# Patient Record
Sex: Male | Born: 1965 | Race: White | Hispanic: No | Marital: Married | Smoking: Never smoker
Health system: Southern US, Community
[De-identification: ages and names within clinical notes are randomized; demographics above are authoritative.]

## PROBLEM LIST (undated history)

## (undated) DIAGNOSIS — C4491 Basal cell carcinoma of skin, unspecified: Secondary | ICD-10-CM

## (undated) DIAGNOSIS — D229 Melanocytic nevi, unspecified: Secondary | ICD-10-CM

## (undated) DIAGNOSIS — T7840XA Allergy, unspecified, initial encounter: Secondary | ICD-10-CM

## (undated) HISTORY — PX: TONSILLECTOMY: SUR1361

## (undated) HISTORY — PX: WRIST FRACTURE SURGERY: SHX121

## (undated) HISTORY — DX: Basal cell carcinoma of skin, unspecified: C44.91

## (undated) HISTORY — DX: Allergy, unspecified, initial encounter: T78.40XA

## (undated) HISTORY — DX: Melanocytic nevi, unspecified: D22.9

---

## 2006-11-11 DIAGNOSIS — C4491 Basal cell carcinoma of skin, unspecified: Secondary | ICD-10-CM

## 2006-11-11 HISTORY — DX: Basal cell carcinoma of skin, unspecified: C44.91

## 2009-09-12 DIAGNOSIS — D229 Melanocytic nevi, unspecified: Secondary | ICD-10-CM

## 2009-09-12 HISTORY — DX: Melanocytic nevi, unspecified: D22.9

## 2017-06-02 DIAGNOSIS — C4491 Basal cell carcinoma of skin, unspecified: Secondary | ICD-10-CM

## 2017-06-02 HISTORY — DX: Basal cell carcinoma of skin, unspecified: C44.91

## 2019-04-25 ENCOUNTER — Encounter: Payer: Self-pay | Admitting: Internal Medicine

## 2019-05-26 ENCOUNTER — Ambulatory Visit (AMBULATORY_SURGERY_CENTER): Payer: Self-pay | Admitting: *Deleted

## 2019-05-26 ENCOUNTER — Other Ambulatory Visit: Payer: Self-pay

## 2019-05-26 VITALS — Temp 97.4°F | Ht 74.0 in | Wt 226.0 lb

## 2019-05-26 DIAGNOSIS — Z01818 Encounter for other preprocedural examination: Secondary | ICD-10-CM

## 2019-05-26 DIAGNOSIS — Z1211 Encounter for screening for malignant neoplasm of colon: Secondary | ICD-10-CM

## 2019-05-26 MED ORDER — NA SULFATE-K SULFATE-MG SULF 17.5-3.13-1.6 GM/177ML PO SOLN: ORAL | 0 refills | Status: DC

## 2019-05-26 NOTE — Progress Notes (Signed)

## 2019-06-06 ENCOUNTER — Ambulatory Visit (INDEPENDENT_AMBULATORY_CARE_PROVIDER_SITE_OTHER): Payer: Federal, State, Local not specified - PPO

## 2019-06-06 ENCOUNTER — Other Ambulatory Visit: Payer: Self-pay

## 2019-06-06 ENCOUNTER — Other Ambulatory Visit: Payer: Self-pay | Admitting: Internal Medicine

## 2019-06-06 DIAGNOSIS — Z1159 Encounter for screening for other viral diseases: Secondary | ICD-10-CM

## 2019-06-06 LAB — SARS CORONAVIRUS 2 (TAT 6-24 HRS): SARS Coronavirus 2: NEGATIVE

## 2019-06-09 ENCOUNTER — Encounter: Payer: Self-pay | Admitting: Internal Medicine

## 2019-06-09 ENCOUNTER — Ambulatory Visit (AMBULATORY_SURGERY_CENTER): Payer: Federal, State, Local not specified - PPO | Admitting: Internal Medicine

## 2019-06-09 ENCOUNTER — Other Ambulatory Visit: Payer: Self-pay

## 2019-06-09 VITALS — BP 158/91 | HR 67 | Temp 97.1°F | Resp 11 | Ht 74.0 in | Wt 226.0 lb

## 2019-06-09 DIAGNOSIS — D123 Benign neoplasm of transverse colon: Secondary | ICD-10-CM | POA: Diagnosis not present

## 2019-06-09 DIAGNOSIS — Z1211 Encounter for screening for malignant neoplasm of colon: Secondary | ICD-10-CM | POA: Diagnosis not present

## 2019-06-09 MED ORDER — SODIUM CHLORIDE 0.9 % IV SOLN: 500.0000 mL | Freq: Once | INTRAVENOUS | Status: DC

## 2019-06-09 NOTE — Progress Notes (Signed)
No problems noted in the recovery room. maw 

## 2019-06-09 NOTE — Progress Notes (Signed)
Temp check by:JB Vital check by:CW  The patient states no changes in medical or surgical history since pre-visit screening on 05/26/19.

## 2019-06-09 NOTE — Progress Notes (Signed)
Called to room to assist during endoscopic procedure.  Patient ID and intended procedure confirmed with present staff. Received instructions for my participation in the procedure from the performing physician.  

## 2019-06-09 NOTE — Patient Instructions (Signed)
YOU HAD AN ENDOSCOPIC PROCEDURE TODAY AT [facility]:   Refer to the procedure report that was given to you for any specific questions about what was found during the examination.  If the procedure report does not answer your questions, please call your gastroenterologist to clarify.  If you requested that your care partner not be given the details of your procedure findings, then the procedure report has been included in a sealed envelope for you to review at your convenience later.  YOU SHOULD EXPECT: Some feelings of bloating in the abdomen. Passage of more gas than usual.  Walking can help get rid of the air that was put into your GI tract during the procedure and reduce the bloating. If you had a lower endoscopy (such as a colonoscopy or flexible sigmoidoscopy) you may notice spotting of blood in your stool or on the toilet paper. If you underwent a bowel prep for your procedure, you may not have a normal bowel movement for a few days.  Please Note:  You might notice some irritation and congestion in your nose or some drainage.  This is from the oxygen used during your procedure.  There is no need for concern and it should clear up in a day or so.  SYMPTOMS TO REPORT IMMEDIATELY:   Following lower endoscopy (colonoscopy or flexible sigmoidoscopy):  Excessive amounts of blood in the stool  Significant tenderness or worsening of abdominal pains  Swelling of the abdomen that is new, acute  Fever of 100F or higher    For urgent or emergent issues, a gastroenterologist can be reached at any hour by calling [phone]. Do not use MyChart messaging for urgent concerns.    DIET:  We do recommend a small meal at first, but then you may proceed to your regular diet.  Drink plenty of fluids but you should avoid alcoholic beverages for 24 hours.  ACTIVITY:  You should plan to take it easy for the rest of today and you should NOT DRIVE or use heavy machinery until tomorrow  (because of the sedation medicines used during the test).    FOLLOW UP: Our staff will call the number listed on your records 48-72 hours following your procedure to check on you and address any questions or concerns that you may have regarding the information given to you following your procedure. If we do not reach you, we will leave a message.  We will attempt to reach you two times.  During this call, we will ask if you have developed any symptoms of COVID 19. If you develop any symptoms (ie: fever, flu-like symptoms, shortness of breath, cough etc.) before then, please call [phone].  If you test positive for Covid 19 in the 2 weeks post procedure, please call and report this information to [place].    If any biopsies were taken you will be contacted by phone or by letter within the next 1-3 weeks.  Please call us at [phone] if you have not heard about the biopsies in 3 weeks.    SIGNATURES/CONFIDENTIALITY: You and/or your care partner have signed paperwork which will be entered into your electronic medical record.  These signatures attest to the fact that that the information above on your After Visit Summary has been reviewed and is understood.  Full responsibility of the confidentiality of this discharge information lies with you and/or your care-partner.     Handouts were given to you on polyps, diverticulosis, and  hemorrhoids. You may  resume your current medications today. Await biopsy results. Please call if any questions or concerns.

## 2019-06-09 NOTE — Op Note (Signed)
[facility] Patient Name: Jared Burton Procedure Date: 06/09/2019 10:27 AM MRN: [id number] Endoscopist: Docia Chuck. Henrene Pastor , MD Age: 54 Referring MD:  Date of Birth: 1965/03/22 Gender: Male Account #: [id number] Procedure:                Colonoscopy with cold snare polypectomy x 1 Indications:              Screening for colorectal malignant neoplasm Medicines:                Monitored Anesthesia Care Procedure:                Pre-Anesthesia Assessment:                           - Prior to the procedure, a History and Physical                            was performed, and patient medications and                            allergies were reviewed. The patient's tolerance of                            previous anesthesia was also reviewed. The risks                            and benefits of the procedure and the sedation                            options and risks were discussed with the patient.                            All questions were answered, and informed consent                            was obtained. Prior Anticoagulants: The patient has                            taken no previous anticoagulant or antiplatelet                            agents. ASA Grade Assessment: II - A patient with                            mild systemic disease. After reviewing the risks                            and benefits, the patient was deemed in                            satisfactory condition to undergo the procedure.                           After obtaining informed consent, the colonoscope  was passed under direct vision. Throughout the                            procedure, the patient's blood pressure, pulse, and                            oxygen saturations were monitored continuously. The                            Colonoscope was introduced through the anus and                            advanced to the the cecum, identified by      appendiceal orifice and ileocecal valve. The                            ileocecal valve, appendiceal orifice, and rectum                            were photographed. The quality of the bowel                            preparation was excellent. The colonoscopy was                            performed without difficulty. The patient tolerated                            the procedure well. The bowel preparation used was                            SUPREP via split dose instruction. Scope In: 10:31:43 AM Scope Out: 10:44:44 AM Scope Withdrawal Time: 0 hours 10 minutes 4 seconds  Total Procedure Duration: 0 hours 13 minutes 1 second  Findings:                 A 2 mm polyp was found in the transverse colon. The                            polyp was removed with a cold snare. Resection and                            retrieval were complete.                           Scattered medium and small-mouthed diverticula were                            found in the entire colon.                           Internal hemorrhoids were found during                            retroflexion. The hemorrhoids  were small.                           The exam was otherwise without abnormality on                            direct and retroflexion views. Complications:            No immediate complications. Estimated blood loss:                            None. Estimated Blood Loss:     Estimated blood loss: none. Impression:               - One 2 mm polyp in the transverse colon, removed                            with a cold snare. Resected and retrieved.                           - Diverticulosis in the entire examined colon.                           - Internal hemorrhoids.                           - The examination was otherwise normal on direct                            and retroflexion views. Recommendation:           - Repeat colonoscopy in 7-10 years for surveillance.                           - Patient  has a contact number available for                            emergencies. The signs and symptoms of potential                            delayed complications were discussed with the                            patient. Return to normal activities tomorrow.                            Written discharge instructions were provided to the                            patient.                           - Resume previous diet.                           - Continue present medications.                           -  Await pathology results. Docia Chuck. Henrene Pastor, MD 06/09/2019 10:52:55 AM This report has been signed electronically.

## 2019-06-09 NOTE — Progress Notes (Signed)
pt tolerated well. VSS. awake and to recovery. Report given to RN.  

## 2019-06-13 ENCOUNTER — Encounter: Payer: Self-pay | Admitting: Internal Medicine

## 2019-06-13 ENCOUNTER — Telehealth: Payer: Self-pay | Admitting: *Deleted

## 2019-06-13 ENCOUNTER — Telehealth: Payer: Self-pay

## 2019-06-13 NOTE — Telephone Encounter (Signed)
First attempt, left VM.  

## 2019-06-13 NOTE — Telephone Encounter (Signed)
Left message on follow up call. 

## 2019-08-01 ENCOUNTER — Ambulatory Visit: Payer: Federal, State, Local not specified - PPO | Attending: Internal Medicine

## 2019-08-01 DIAGNOSIS — Z20822 Contact with and (suspected) exposure to covid-19: Secondary | ICD-10-CM

## 2019-08-02 LAB — SARS-COV-2, NAA 2 DAY TAT

## 2019-08-02 LAB — NOVEL CORONAVIRUS, NAA: SARS-CoV-2, NAA: NOT DETECTED

## 2020-06-27 ENCOUNTER — Ambulatory Visit: Payer: Federal, State, Local not specified - PPO | Admitting: Dermatology

## 2020-06-27 ENCOUNTER — Other Ambulatory Visit: Payer: Self-pay

## 2020-06-27 ENCOUNTER — Encounter: Payer: Self-pay | Admitting: Dermatology

## 2020-06-27 DIAGNOSIS — C44712 Basal cell carcinoma of skin of right lower limb, including hip: Secondary | ICD-10-CM

## 2020-06-27 DIAGNOSIS — C4372 Malignant melanoma of left lower limb, including hip: Secondary | ICD-10-CM | POA: Diagnosis not present

## 2020-06-27 DIAGNOSIS — Z1283 Encounter for screening for malignant neoplasm of skin: Secondary | ICD-10-CM | POA: Diagnosis not present

## 2020-06-27 DIAGNOSIS — C44719 Basal cell carcinoma of skin of left lower limb, including hip: Secondary | ICD-10-CM

## 2020-06-27 DIAGNOSIS — L821 Other seborrheic keratosis: Secondary | ICD-10-CM

## 2020-06-27 DIAGNOSIS — D485 Neoplasm of uncertain behavior of skin: Secondary | ICD-10-CM

## 2020-06-27 NOTE — Patient Instructions (Signed)

## 2020-07-09 ENCOUNTER — Telehealth (INDEPENDENT_AMBULATORY_CARE_PROVIDER_SITE_OTHER): Payer: Federal, State, Local not specified - PPO | Admitting: Dermatology

## 2020-07-09 ENCOUNTER — Encounter: Payer: Self-pay | Admitting: Dermatology

## 2020-07-09 NOTE — Telephone Encounter (Signed)
I called Jared Burton and reached him on his listed cell number.  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  He is to be scheduled for repeat needle biopsy under ultrasound guidance, but I emphasized the need to make sure the surgeons know of the melanoma on the same side.  If he has not heard from them or reach them in the next 2 days, he will call me back and we will contact that office for him.  All his questions were answered.  He has my cell phone.

## 2020-07-12 ENCOUNTER — Telehealth: Payer: Self-pay | Admitting: *Deleted

## 2020-07-12 NOTE — Telephone Encounter (Signed)
Phone call from [facility] needing patients recent pathology report. I faxed over the report to them.

## 2020-07-28 NOTE — Progress Notes (Signed)
   Follow-Up Visit   Subjective  Jared Burton is a 55 y.o. male who presents for the following: Annual Exam (New lesion on left inner thigh & Left scalp- "dark").  Several changing spots on legs plus dark spot on left scalp; general skin exam Location:  Duration:  Quality:  Associated Signs/Symptoms: Modifying Factors:  Severity:  Timing: Context:   Objective  Well appearing patient in no apparent distress; mood and affect are within normal limits. Objective  Left Knee - Anterior: Pink-gray     Objective  Right inner calf: Follow-up for a     Objective  Left Lower Leg - Anterior: Centrally ulcerated 6 mm pink papule, SCCA     Objective  Mid Back: Annual skin examination, 3 atypical lesions on legs will be biopsied.  Objective  Left Parietal Scalp: Brown textured 5 mm papule    A full examination was performed including scalp, head, eyes, ears, nose, lips, neck, chest, axillae, abdomen, back, buttocks, bilateral upper extremities, bilateral lower extremities, hands, feet, fingers, toes, fingernails, and toenails. All findings within normal limits unless otherwise noted below.   Assessment & Plan    Neoplasm of uncertain behavior of skin (3) Left Knee - Anterior  Skin / nail biopsy Type of biopsy: tangential   Informed consent: discussed and consent obtained   Timeout: patient name, date of birth, surgical site, and procedure verified   Anesthesia: the lesion was anesthetized in a standard fashion   Anesthetic:  1% lidocaine w/ epinephrine 1-100,000 local infiltration Instrument used: flexible razor blade   Hemostasis achieved with: ferric subsulfate   Outcome: patient tolerated procedure well   Post-procedure details: wound care instructions given    Specimen 1 - Surgical pathology Differential Diagnosis: scc vs bcc  Check Margins: No  Right inner calf  Skin / nail biopsy Type of biopsy: tangential   Informed consent: discussed and consent  obtained   Timeout: patient name, date of birth, surgical site, and procedure verified   Anesthesia: the lesion was anesthetized in a standard fashion   Anesthetic:  1% lidocaine w/ epinephrine 1-100,000 local infiltration Instrument used: flexible razor blade   Hemostasis achieved with: ferric subsulfate   Outcome: patient tolerated procedure well   Post-procedure details: wound care instructions given    Specimen 2 - Surgical pathology Differential Diagnosis: scc vs bcc  Check Margins: No  Left Lower Leg - Anterior  Skin / nail biopsy Type of biopsy: tangential   Informed consent: discussed and consent obtained   Timeout: patient name, date of birth, surgical site, and procedure verified   Anesthesia: the lesion was anesthetized in a standard fashion   Anesthetic:  1% lidocaine w/ epinephrine 1-100,000 local infiltration Instrument used: flexible razor blade   Hemostasis achieved with: ferric subsulfate   Outcome: patient tolerated procedure well   Post-procedure details: wound care instructions given    Specimen 3 - Surgical pathology Differential Diagnosis: scc vs bcc  Check Margins: No  Encounter for screening for malignant neoplasm of skin Mid Back  Annual skin examination, encouraged to self examine twice annually.  Seborrheic keratosis Left Parietal Scalp  May leave if stable      I, Lavonna Monarch, MD, have reviewed all documentation for this visit.  The documentation on 07/28/20 for the exam, diagnosis, procedures, and orders are all accurate and complete.

## 2020-08-06 ENCOUNTER — Telehealth: Payer: Self-pay | Admitting: Oncology

## 2020-08-06 NOTE — Telephone Encounter (Signed)
Scheduled appt per 6/13 referral. Called pt, no answer. Left msg with appt date and time.

## 2020-08-15 ENCOUNTER — Other Ambulatory Visit: Payer: Self-pay

## 2020-08-15 ENCOUNTER — Inpatient Hospital Stay: Payer: Federal, State, Local not specified - PPO | Attending: Oncology | Admitting: Oncology

## 2020-08-15 ENCOUNTER — Ambulatory Visit: Payer: Federal, State, Local not specified - PPO | Admitting: Oncology

## 2020-08-15 DIAGNOSIS — C4371 Malignant melanoma of right lower limb, including hip: Secondary | ICD-10-CM | POA: Diagnosis not present

## 2020-08-15 DIAGNOSIS — C774 Secondary and unspecified malignant neoplasm of inguinal and lower limb lymph nodes: Secondary | ICD-10-CM | POA: Diagnosis not present

## 2020-08-15 DIAGNOSIS — Z85820 Personal history of malignant melanoma of skin: Secondary | ICD-10-CM

## 2020-08-15 DIAGNOSIS — C7951 Secondary malignant neoplasm of bone: Secondary | ICD-10-CM | POA: Insufficient documentation

## 2020-08-15 DIAGNOSIS — C430 Malignant melanoma of lip: Secondary | ICD-10-CM

## 2020-08-15 DIAGNOSIS — C4372 Malignant melanoma of left lower limb, including hip: Secondary | ICD-10-CM | POA: Diagnosis not present

## 2020-08-15 DIAGNOSIS — C439 Malignant melanoma of skin, unspecified: Secondary | ICD-10-CM

## 2020-08-15 MED ORDER — PROCHLORPERAZINE MALEATE 10 MG PO TABS: 10.0000 mg | ORAL_TABLET | Freq: Four times a day (QID) | ORAL | 0 refills | Status: DC | PRN

## 2020-08-15 NOTE — Progress Notes (Signed)
Reason for the request:    Melanoma  HPI: I was asked by Dr. Nash Mantis to evaluate Jared Burton for the evaluation of melanoma.  He is a 55 year old man with with allergy as well as basal cell carcinoma of the skin diagnosed on multiple occasions.  He was found to have a mass on the left groin growing recently.  He was evaluated by Dr. Nash Mantis of general surgery at [facility] and underwent a core biopsy in May 2022.  The biopsy of his left inguinal area showed melanoma.  He subsequently underwent a wide excision of the left thigh melanoma as well as a left inguinal lymph node dissection on July 26, 2020.  The final pathology showed no residual melanoma with 1 out of 10 lymph nodes showed metastatic disease.  PET scan obtained on Jul 20, 2020 showed hypermetabolic left inguinal mass and small hypermetabolic distal left external iliac lymph node.   He has also had a left knee/thigh skin lesion which showed malignant melanoma measuring 1.25 mm with the pathological staging T2A.  This likely represents the primary tumor.  Since his surgery, he reports feeling well and has tolerated the procedure without any major complaints.  He does report lower extremity edema but overall fully healed.  He is able to ambulate without any major difficulties.  He does not report any headaches, blurry vision, syncope or seizures. Does not report any fevers, chills or sweats.  Does not report any cough, wheezing or hemoptysis.  Does not report any chest pain, palpitation, orthopnea or leg edema.  Does not report any nausea, vomiting or abdominal pain.  Does not report any constipation or diarrhea.  Does not report any skeletal complaints.    Does not report frequency, urgency or hematuria.  Does not report any skin rashes or lesions. Does not report any heat or cold intolerance.  Does not report any lymphadenopathy or petechiae.  Does not report any anxiety or depression.  Remaining review of systems is negative.     Past  Medical History:  Diagnosis Date   Allergy    Atypical nevus 09/12/2009   Mid Lower - Moderate to Severe   Atypical nevus 09/12/2009   Left Medial Leg - Severe/Evolution of Melanoma in Situ   BCC (basal cell carcinoma of skin) 11/11/2006   Left Upper Arm   BCC (basal cell carcinoma of skin) 11/11/2006   Left Temple   BCC (basal cell carcinoma of skin) 11/11/2006   Right Front Shoulder   BCC (basal cell carcinoma of skin) 11/11/2006   Right Arm Crease/Elbow   BCC (basal cell carcinoma of skin) 09/12/2009   Left Shoulder   BCC (basal cell carcinoma of skin) 09/12/2009   Left Lower Back   BCC (basal cell carcinoma of skin) 12/12/2009   Left Top Shoulder   BCC (basal cell carcinoma of skin) 05/23/2013   Left Posterior Auricular, Right Chest, Right Upper Back, Right Mid Back Lateral, Right Mid Back Medial, Left Upper Arm, Left Calf   BCC (basal cell carcinoma of skin) Ulcerated 09/12/2009   Right Lateral Leg   BCC (basal cell carcinoma of skin) Ulcerated and Infiltrative 09/12/2009   Right Hand   Infiltrative basal cell carcinoma (BCC) 06/02/2017   Left Tip of Nose   Nodular basal cell carcinoma (BCC) 06/02/2017   Mid Upper Back, Left Nare, Right Upper Arm Inferior   Superficial basal cell carcinoma (BCC) 11/11/2006   Left Upper Back   Superficial basal cell carcinoma (BCC) 11/11/2006  Mid Upper Chest   Superficial basal cell carcinoma (BCC) 06/02/2017    Right Upper Arm Superior, Back Left Lower Thigh, Back Right Knee Crease   Superficial basal cell carcinoma (BCC) 06/02/2017   Left Ear Rim   Superficial nodular basal cell carcinoma (BCC) 06/02/2017   Right Inner Deltoid  :   Past Surgical History:  Procedure Laterality Date   TONSILLECTOMY     as a child   WRIST FRACTURE SURGERY     as a child  :   Current Outpatient Medications:    IBUPROFEN PO, Take by mouth. As needed, Disp: , Rfl:    levocetirizine (XYZAL) 5 MG tablet, Take 5 mg by mouth every evening., Disp:  , Rfl: :  No Known Allergies:   Family History  Problem Relation Age of Onset   Colon cancer Neg Hx    Colon polyps Neg Hx    Esophageal cancer Neg Hx    Stomach cancer Neg Hx    Rectal cancer Neg Hx   :   Social History   Socioeconomic History   Marital status: Married    Spouse name: Not on file   Number of children: Not on file   Years of education: Not on file   Highest education level: Not on file  Occupational History   Not on file  Tobacco Use   Smoking status: Never   Smokeless tobacco: Never  Vaping Use   Vaping Use: Never used  Substance and Sexual Activity   Alcohol use: Yes    Comment: a few drinks / week (beer and wine)   Drug use: Never   Sexual activity: Not on file  Other Topics Concern   Not on file  Social History Narrative   Not on file   Social Determinants of Health   Financial Resource Strain: Not on file  Food Insecurity: Not on file  Transportation Needs: Not on file  Physical Activity: Not on file  Stress: Not on file  Social Connections: Not on file  Intimate Partner Violence: Not on file  :  Pertinent items are noted in HPI.  Exam: Blood pressure (!) 149/87, pulse 69, temperature (!) 97 F (36.1 C), temperature source Tympanic, resp. rate 20, weight 221 lb 4.8 oz (100.4 kg), SpO2 100 %. ECOG 1 General appearance: alert and cooperative appeared without distress. Head: atraumatic without any abnormalities. Eyes: conjunctivae/corneas clear. PERRL.  Sclera anicteric. Throat: lips, mucosa, and tongue normal; without oral thrush or ulcers. Resp: clear to auscultation bilaterally without rhonchi, wheezes or dullness to percussion. Cardio: regular rate and rhythm, S1, S2 normal, no murmur.  Left leg edema noted. GI: soft, non-tender; bowel sounds normal; no masses,  no organomegaly Skin: Well-healed scar noted on his left knee and inguinal area. Lymph nodes: Cervical, supraclavicular, and axillary nodes normal. Neurologic: Grossly  normal without any motor, sensory or deep tendon reflexes. Musculoskeletal: No joint deformity or effusion.   Assessment and Plan:   55 year old with:  1.  Melanoma of the left lower extremity presented with a knee lesion as well as inguinal lymph node involvement.  He is status post wide excision and lymph node dissection and found to have T2aN1 melanoma with 1 out of 10 lymph nodes involved.  The original tumor appears to be superficial spreading without ulceration and risk tumor infiltrating lymphocytes.   The natural course of this disease was reviewed at this time and treatment options were discussed.  Given his high risk disease he would benefit from  additional systemic therapy.  The role of adjuvant immunotherapy in this particular setting was discussed.  The risk of progression free survival that has been documented.  His BRAF status is unknown and will obtain his tumor for evaluation next at tumor board and obtain BRAF analysis.  Complication associated with immunotherapy were discussed.  These include nausea, fatigue, immune mediated complications among others.  Schedule ranged from every 3 to 6 weeks to complete a year.  In the meantime strict surveillance will be required with repeat imaging studies every 6 months.  He is agreeable to proceed at this time.   2.  IV access: Peripheral veins will be in use at this time.   3.  Immune mediated complications: These include pneumonitis, colitis and thyroid disease.  He will continue to have thyroid function monitoring.   4.  Antiemetics: Prescription for Compazine will be available to him.  5.  Dermatology surveillance: He will continue to follow with dermatology as he has been doing.   6.  Follow-up: In the near future to start treatment.  60  minutes were dedicated to this visit. The time was spent on reviewing laboratory data, imaging studies, discussing treatment options, reviewing pathology results and answering questions  regarding future plan.    A copy of this consult has been forwarded to the requesting physician.

## 2020-08-15 NOTE — Progress Notes (Signed)
START ON PATHWAY REGIMEN - Melanoma and Other Skin Cancers     A cycle is every 42 days:     Pembrolizumab   **Always confirm dose/schedule in your pharmacy ordering system**  Patient Characteristics: Melanoma, Cutaneous/Unknown Primary, Postoperative without Neoadjuvant Therapy (Pathologic Staging), Any pT, pN+, BRAF V600 Wild Type / BRAF V600 Results Pending or Unknown Disease Classification: Melanoma Disease Subtype: Cutaneous BRAF V600 Mutation Status: Awaiting BRAF V600 Results Therapeutic Status: Postoperative without Neoadjuvant Therapy (Pathologic Staging) AJCC T Category: pT2a AJCC N Category: pN1c AJCC M Category: cM0 AJCC 8 Stage Grouping: IIIB Intent of Therapy: Curative Intent, Discussed with Patient

## 2020-08-22 ENCOUNTER — Telehealth: Payer: Self-pay | Admitting: Oncology

## 2020-08-22 NOTE — Telephone Encounter (Signed)
R/s appt per 6/29 sch msg. Pt aware.

## 2020-08-23 ENCOUNTER — Other Ambulatory Visit: Payer: Federal, State, Local not specified - PPO

## 2020-08-24 ENCOUNTER — Other Ambulatory Visit: Payer: Self-pay

## 2020-08-24 ENCOUNTER — Inpatient Hospital Stay: Payer: Federal, State, Local not specified - PPO | Attending: Oncology

## 2020-08-24 ENCOUNTER — Encounter: Payer: Self-pay | Admitting: Oncology

## 2020-08-24 DIAGNOSIS — Z79899 Other long term (current) drug therapy: Secondary | ICD-10-CM | POA: Insufficient documentation

## 2020-08-24 DIAGNOSIS — Z5112 Encounter for antineoplastic immunotherapy: Secondary | ICD-10-CM | POA: Insufficient documentation

## 2020-08-24 DIAGNOSIS — C4372 Malignant melanoma of left lower limb, including hip: Secondary | ICD-10-CM | POA: Insufficient documentation

## 2020-08-24 DIAGNOSIS — C774 Secondary and unspecified malignant neoplasm of inguinal and lower limb lymph nodes: Secondary | ICD-10-CM | POA: Insufficient documentation

## 2020-08-24 NOTE — Progress Notes (Signed)
Met with patient at registration to introduce myself as Arboriculturist and to offer available resources.  Discussed one-time $1000 Radio broadcast assistant to assist with personal expenses while going through treatment. Also, discussed available copay assistance([employer] for [facility]) for specific treatment drug if he has not met his ded/OOP and insurance leaves him with a balance.  Gave him my card if interested in applying and for any additional financial questions or concerns.

## 2020-08-31 ENCOUNTER — Telehealth: Payer: Self-pay | Admitting: Oncology

## 2020-08-31 NOTE — Telephone Encounter (Signed)
Pt called in requesting to move his appts back by a few weeks. I moved his appts per requested and sent msg to MD informing him of the change. Per MD okay.

## 2020-09-04 ENCOUNTER — Other Ambulatory Visit: Payer: Federal, State, Local not specified - PPO

## 2020-09-04 ENCOUNTER — Ambulatory Visit: Payer: Federal, State, Local not specified - PPO

## 2020-09-11 NOTE — Progress Notes (Signed)
Pharmacist Chemotherapy Monitoring - Initial Assessment    Anticipated start date: 09/18/20   The following has been reviewed per standard work regarding the patient's treatment regimen: The patient's diagnosis, treatment plan and drug doses, and organ/hematologic function Lab orders and baseline tests specific to treatment regimen  The treatment plan start date, drug sequencing, and pre-medications Prior authorization status  Patient's documented medication list, including drug-drug interaction screen and prescriptions for anti-emetics and supportive care specific to the treatment regimen The drug concentrations, fluid compatibility, administration routes, and timing of the medications to be used The patient's access for treatment and lifetime cumulative dose history, if applicable  The patient's medication allergies and previous infusion related reactions, if applicable   Changes made to treatment plan:  N/A  Follow up needed:  N/A   Larene Beach, RPH, 09/11/2020  4:17 PM

## 2020-09-13 ENCOUNTER — Encounter: Payer: Self-pay | Admitting: Dermatology

## 2020-09-13 ENCOUNTER — Ambulatory Visit (INDEPENDENT_AMBULATORY_CARE_PROVIDER_SITE_OTHER): Payer: Federal, State, Local not specified - PPO | Admitting: Dermatology

## 2020-09-13 ENCOUNTER — Other Ambulatory Visit: Payer: Self-pay

## 2020-09-13 DIAGNOSIS — Z85820 Personal history of malignant melanoma of skin: Secondary | ICD-10-CM | POA: Diagnosis not present

## 2020-09-13 DIAGNOSIS — S30860A Insect bite (nonvenomous) of lower back and pelvis, initial encounter: Secondary | ICD-10-CM | POA: Diagnosis not present

## 2020-09-13 DIAGNOSIS — W57XXXA Bitten or stung by nonvenomous insect and other nonvenomous arthropods, initial encounter: Secondary | ICD-10-CM

## 2020-09-13 NOTE — Patient Instructions (Signed)

## 2020-09-18 ENCOUNTER — Inpatient Hospital Stay: Payer: Federal, State, Local not specified - PPO

## 2020-09-18 ENCOUNTER — Other Ambulatory Visit: Payer: Self-pay

## 2020-09-18 VITALS — BP 169/95 | HR 63 | Temp 97.7°F | Resp 18 | Wt 225.8 lb

## 2020-09-18 DIAGNOSIS — C439 Malignant melanoma of skin, unspecified: Secondary | ICD-10-CM

## 2020-09-18 DIAGNOSIS — C4372 Malignant melanoma of left lower limb, including hip: Secondary | ICD-10-CM | POA: Diagnosis present

## 2020-09-18 DIAGNOSIS — Z5112 Encounter for antineoplastic immunotherapy: Secondary | ICD-10-CM | POA: Diagnosis present

## 2020-09-18 DIAGNOSIS — Z79899 Other long term (current) drug therapy: Secondary | ICD-10-CM | POA: Diagnosis not present

## 2020-09-18 DIAGNOSIS — C774 Secondary and unspecified malignant neoplasm of inguinal and lower limb lymph nodes: Secondary | ICD-10-CM | POA: Diagnosis not present

## 2020-09-18 LAB — CMP (CANCER CENTER ONLY)
ALT: 14 U/L (ref 0–44)
AST: 13 U/L — ABNORMAL LOW (ref 15–41)
Albumin: 3.5 g/dL (ref 3.5–5.0)
Alkaline Phosphatase: 98 U/L (ref 38–126)
Anion gap: 8 (ref 5–15)
BUN: 14 mg/dL (ref 6–20)
CO2: 26 mmol/L (ref 22–32)
Calcium: 9.2 mg/dL (ref 8.9–10.3)
Chloride: 106 mmol/L (ref 98–111)
Creatinine: 1.05 mg/dL (ref 0.61–1.24)
GFR, Estimated: >60 mL/min (ref >60)
Glucose, Bld: 113 mg/dL — ABNORMAL HIGH (ref 70–99)
Potassium: 3.8 mmol/L (ref 3.5–5.1)
Sodium: 140 mmol/L (ref 135–145)
Total Bilirubin: 0.8 mg/dL (ref 0.3–1.2)
Total Protein: 6.3 g/dL — ABNORMAL LOW (ref 6.5–8.1)

## 2020-09-18 LAB — CBC WITH DIFFERENTIAL (CANCER CENTER ONLY)
Abs Immature Granulocytes: 0.01 10*3/uL (ref 0.00–0.07)
Basophils Absolute: 0 10*3/uL (ref 0.0–0.1)
Basophils Relative: 0 %
Eosinophils Absolute: 0.1 10*3/uL (ref 0.0–0.5)
Eosinophils Relative: 1 %
HCT: 45.1 % (ref 39.0–52.0)
Hemoglobin: 15.4 g/dL (ref 13.0–17.0)
Immature Granulocytes: 0 %
Lymphocytes Relative: 23 %
Lymphs Abs: 1.6 10*3/uL (ref 0.7–4.0)
MCH: 28.8 pg (ref 26.0–34.0)
MCHC: 34.1 g/dL (ref 30.0–36.0)
MCV: 84.3 fL (ref 80.0–100.0)
Monocytes Absolute: 0.7 10*3/uL (ref 0.1–1.0)
Monocytes Relative: 10 %
Neutro Abs: 4.6 10*3/uL (ref 1.7–7.7)
Neutrophils Relative %: 66 %
Platelet Count: 215 10*3/uL (ref 150–400)
RBC: 5.35 MIL/uL (ref 4.22–5.81)
RDW: 13.2 % (ref 11.5–15.5)
WBC Count: 7.1 10*3/uL (ref 4.0–10.5)
nRBC: 0 % (ref 0.0–0.2)

## 2020-09-18 LAB — TSH: TSH: 1.399 u[IU]/mL (ref 0.320–4.118)

## 2020-09-18 MED ORDER — SODIUM CHLORIDE 0.9 % IV SOLN
400.0000 mg | Freq: Once | INTRAVENOUS | Status: AC
  Administered 2020-09-18: 400 mg via INTRAVENOUS
  Filled 2020-09-18: qty 16

## 2020-09-18 MED ORDER — SODIUM CHLORIDE 0.9 % IV SOLN
Freq: Once | INTRAVENOUS | Status: AC
Start: 2020-09-18 — End: 2020-09-18
  Filled 2020-09-18: qty 250

## 2020-09-18 NOTE — Patient Instructions (Signed)
[facility] ONCOLOGY  Discharge Instructions: Thank you for choosing [facility] to provide your oncology and hematology care.   If you have a lab appointment with the [facility], please go directly to the [facility] and check in at the registration area.   Wear comfortable clothing and clothing appropriate for easy access to any Portacath or PICC line.   We strive to give you quality time with your provider. You may need to reschedule your appointment if you arrive late (15 or more minutes).  Arriving late affects you and other patients whose appointments are after yours.  Also, if you miss three or more appointments without notifying the office, you may be dismissed from the clinic at the provider's discretion.      For prescription refill requests, have your pharmacy contact our office and allow 72 hours for refills to be completed.    Today you received the following chemotherapy and/or immunotherapy agents: Keytruda    To help prevent nausea and vomiting after your treatment, we encourage you to take your nausea medication as directed.  BELOW ARE SYMPTOMS THAT SHOULD BE REPORTED IMMEDIATELY: *FEVER GREATER THAN 100.4 F (38 C) OR HIGHER *CHILLS OR SWEATING *NAUSEA AND VOMITING THAT IS NOT CONTROLLED WITH YOUR NAUSEA MEDICATION *UNUSUAL SHORTNESS OF BREATH *UNUSUAL BRUISING OR BLEEDING *URINARY PROBLEMS (pain or burning when urinating, or frequent urination) *BOWEL PROBLEMS (unusual diarrhea, constipation, pain near the anus) TENDERNESS IN MOUTH AND THROAT WITH OR WITHOUT PRESENCE OF ULCERS (sore throat, sores in mouth, or a toothache) UNUSUAL RASH, SWELLING OR PAIN  UNUSUAL VAGINAL DISCHARGE OR ITCHING   Items with * indicate a potential emergency and should be followed up as soon as possible or go to the Emergency Department if any problems should occur.  Please show the CHEMOTHERAPY ALERT CARD or IMMUNOTHERAPY ALERT CARD at check-in to the  Emergency Department and triage nurse.  Should you have questions after your visit or need to cancel or reschedule your appointment, please contact [facility]  Dept: [phone]  and follow the prompts.  Office hours are 8:00 a.m. to 4:30 p.m. Monday - Friday. Please note that voicemails left after 4:00 p.m. may not be returned until the following business day.  We are closed weekends and major holidays. You have access to a nurse at all times for urgent questions. Please call the main number to the clinic Dept: [phone] and follow the prompts.   For any non-urgent questions, you may also contact your provider using MyChart. We now offer e-Visits for anyone 57 and older to request care online for non-urgent symptoms. For details visit [email or web address].   Also download the MyChart app! Go to the app store, search "MyChart", open the app, select [facility], and log in with your MyChart username and password.  Due to Covid, a mask is required upon entering the hospital/clinic. If you do not have a mask, one will be given to you upon arrival. For doctor visits, patients may have 1 support person aged 81 or older with them. For treatment visits, patients cannot have anyone with them due to current Covid guidelines and our immunocompromised population.   Pembrolizumab injection What is this medication? PEMBROLIZUMAB (pem broe liz ue mab) is a monoclonal antibody. It is used totreat certain types of cancer. This medicine may be used for other purposes; ask your health care provider orpharmacist if you have questions. COMMON BRAND NAME(S): Keytruda What should I tell my  care team before I take this medication? They need to know if you have any of these conditions: autoimmune diseases like Crohn's disease, ulcerative colitis, or lupus have had or planning to have an allogeneic stem cell transplant (uses someone else's stem cells) history of organ  transplant history of chest radiation nervous system problems like myasthenia gravis or Guillain-Barre syndrome an unusual or allergic reaction to pembrolizumab, other medicines, foods, dyes, or preservatives pregnant or trying to get pregnant breast-feeding How should I use this medication? This medicine is for infusion into a vein. It is given by a health careprofessional in a hospital or clinic setting. A special MedGuide will be given to you before each treatment. Be sure to readthis information carefully each time. Talk to your pediatrician regarding the use of this medicine in children. While this drug may be prescribed for children as young as 6 months for selectedconditions, precautions do apply. Overdosage: If you think you have taken too much of this medicine contact apoison control center or emergency room at once. NOTE: This medicine is only for you. Do not share this medicine with others. What if I miss a dose? It is important not to miss your dose. Call your doctor or health careprofessional if you are unable to keep an appointment. What may interact with this medication? Interactions have not been studied. This list may not describe all possible interactions. Give your health care provider a list of all the medicines, herbs, non-prescription drugs, or dietary supplements you use. Also tell them if you smoke, drink alcohol, or use illegaldrugs. Some items may interact with your medicine. What should I watch for while using this medication? Your condition will be monitored carefully while you are receiving thismedicine. You may need blood work done while you are taking this medicine. Do not become pregnant while taking this medicine or for 4 months after stopping it. Women should inform their doctor if they wish to become pregnant or think they might be pregnant. There is a potential for serious side effects to an unborn child. Talk to your health care professional or pharmacist for  more information. Do not breast-feed an infant while taking this medicine orfor 4 months after the last dose. What side effects may I notice from receiving this medication? Side effects that you should report to your doctor or health care professionalas soon as possible: allergic reactions like skin rash, itching or hives, swelling of the face, lips, or tongue bloody or black, tarry breathing problems changes in vision chest pain chills confusion constipation cough diarrhea dizziness or feeling faint or lightheaded fast or irregular heartbeat fever flushing joint pain low blood counts - this medicine may decrease the number of white blood cells, red blood cells and platelets. You may be at increased risk for infections and bleeding. muscle pain muscle weakness pain, tingling, numbness in the hands or feet persistent headache redness, blistering, peeling or loosening of the skin, including inside the mouth signs and symptoms of high blood sugar such as dizziness; dry mouth; dry skin; fruity breath; nausea; stomach pain; increased hunger or thirst; increased urination signs and symptoms of kidney injury like trouble passing urine or change in the amount of urine signs and symptoms of liver injury like dark urine, light-colored stools, loss of appetite, nausea, right upper belly pain, yellowing of the eyes or skin sweating swollen lymph nodes weight loss Side effects that usually do not require medical attention (report to yourdoctor or health care professional if they continue or are  bothersome): decreased appetite hair loss tiredness This list may not describe all possible side effects. Call your doctor for medical advice about side effects. You may report side effects to FDA at1-800-FDA-1088. Where should I keep my medication? This drug is given in a hospital or clinic and will not be stored at home. NOTE: This sheet is a summary. It may not cover all possible information. If you  have questions about this medicine, talk to your doctor, pharmacist, orhealth care provider.  2022 Elsevier/Gold Standard (2019-01-12 21:44:53)

## 2020-09-19 ENCOUNTER — Telehealth: Payer: Self-pay | Admitting: *Deleted

## 2020-09-22 ENCOUNTER — Encounter: Payer: Self-pay | Admitting: Dermatology

## 2020-09-22 NOTE — Progress Notes (Signed)
   Follow-Up Visit   Subjective  Jared Burton is a 55 y.o. male who presents for the following: Procedure (Bcc x 2 right inner calf, left lower leg ant. Patient on doxycycline due to tick bite ).  Melanoma with lymph node involvement, nonmelanoma skin cancer on each leg. Location:  Duration:  Quality:  Associated Signs/Symptoms: Modifying Factors:  Severity:  Timing: Context:   Objective  Well appearing patient in no apparent distress; mood and affect are within normal limits. Left Knee - Anterior History of melanoma plus lymph node surgery done and patient stated he will start keytruda every 6 weeks next week defer any treatment today        Left Hip       A focused examination was performed including left lower abdomen and groin, both legs.. Relevant physical exam findings are noted in the Assessment and Plan.   Assessment & Plan    Personal history of malignant melanoma of skin Left Knee - Anterior  I spent roughly 27minutes discussing in some detail the nature of melanoma with lymph node involvement and the treatment he will be starting.  He already has significant postop edema in the left upper leg with possible lymphedema, so treatment of the nonmelanoma skin cancer on lower left leg is best deferred.  Tick bite of lower back, initial encounter Left Hip      I, Lavonna Monarch, MD, have reviewed all documentation for this visit.  The documentation on 09/22/20 for the exam, diagnosis, procedures, and orders are all accurate and complete.

## 2020-10-16 ENCOUNTER — Other Ambulatory Visit: Payer: Federal, State, Local not specified - PPO

## 2020-10-16 ENCOUNTER — Ambulatory Visit: Payer: Federal, State, Local not specified - PPO

## 2020-10-16 ENCOUNTER — Ambulatory Visit: Payer: Federal, State, Local not specified - PPO | Admitting: Oncology

## 2020-10-30 ENCOUNTER — Other Ambulatory Visit: Payer: Self-pay

## 2020-10-30 ENCOUNTER — Inpatient Hospital Stay: Payer: Federal, State, Local not specified - PPO | Admitting: Oncology

## 2020-10-30 ENCOUNTER — Inpatient Hospital Stay: Payer: Federal, State, Local not specified - PPO

## 2020-10-30 ENCOUNTER — Inpatient Hospital Stay: Payer: Federal, State, Local not specified - PPO | Attending: Oncology

## 2020-10-30 VITALS — BP 151/92 | HR 75 | Temp 97.8°F | Resp 16 | Ht 74.0 in | Wt 222.1 lb

## 2020-10-30 DIAGNOSIS — C439 Malignant melanoma of skin, unspecified: Secondary | ICD-10-CM | POA: Diagnosis not present

## 2020-10-30 DIAGNOSIS — Z5112 Encounter for antineoplastic immunotherapy: Secondary | ICD-10-CM | POA: Insufficient documentation

## 2020-10-30 DIAGNOSIS — Z79899 Other long term (current) drug therapy: Secondary | ICD-10-CM | POA: Diagnosis not present

## 2020-10-30 DIAGNOSIS — C4372 Malignant melanoma of left lower limb, including hip: Secondary | ICD-10-CM | POA: Insufficient documentation

## 2020-10-30 LAB — CMP (CANCER CENTER ONLY)
ALT: 30 U/L (ref 0–44)
AST: 16 U/L (ref 15–41)
Albumin: 3.4 g/dL — ABNORMAL LOW (ref 3.5–5.0)
Alkaline Phosphatase: 115 U/L (ref 38–126)
Anion gap: 9 (ref 5–15)
BUN: 18 mg/dL (ref 6–20)
CO2: 23 mmol/L (ref 22–32)
Calcium: 9.2 mg/dL (ref 8.9–10.3)
Chloride: 109 mmol/L (ref 98–111)
Creatinine: 1.09 mg/dL (ref 0.61–1.24)
GFR, Estimated: >60 mL/min (ref >60)
Glucose, Bld: 106 mg/dL — ABNORMAL HIGH (ref 70–99)
Potassium: 4.2 mmol/L (ref 3.5–5.1)
Sodium: 141 mmol/L (ref 135–145)
Total Bilirubin: 0.7 mg/dL (ref 0.3–1.2)
Total Protein: 6.3 g/dL — ABNORMAL LOW (ref 6.5–8.1)

## 2020-10-30 LAB — CBC WITH DIFFERENTIAL (CANCER CENTER ONLY)
Abs Immature Granulocytes: 0.02 10*3/uL (ref 0.00–0.07)
Basophils Absolute: 0 10*3/uL (ref 0.0–0.1)
Basophils Relative: 0 %
Eosinophils Absolute: 0.1 10*3/uL (ref 0.0–0.5)
Eosinophils Relative: 1 %
HCT: 44.7 % (ref 39.0–52.0)
Hemoglobin: 15.2 g/dL (ref 13.0–17.0)
Immature Granulocytes: 0 %
Lymphocytes Relative: 20 %
Lymphs Abs: 1.6 10*3/uL (ref 0.7–4.0)
MCH: 27.9 pg (ref 26.0–34.0)
MCHC: 34 g/dL (ref 30.0–36.0)
MCV: 82 fL (ref 80.0–100.0)
Monocytes Absolute: 0.8 10*3/uL (ref 0.1–1.0)
Monocytes Relative: 10 %
Neutro Abs: 5.4 10*3/uL (ref 1.7–7.7)
Neutrophils Relative %: 69 %
Platelet Count: 289 10*3/uL (ref 150–400)
RBC: 5.45 MIL/uL (ref 4.22–5.81)
RDW: 12.6 % (ref 11.5–15.5)
WBC Count: 7.8 10*3/uL (ref 4.0–10.5)
nRBC: 0 % (ref 0.0–0.2)

## 2020-10-30 LAB — TSH: TSH: <0.08 u[IU]/mL — ABNORMAL LOW (ref 0.320–4.118)

## 2020-10-30 MED ORDER — SODIUM CHLORIDE 0.9 % IV SOLN
400.0000 mg | Freq: Once | INTRAVENOUS | Status: AC
  Administered 2020-10-30: 400 mg via INTRAVENOUS
  Filled 2020-10-30: qty 16

## 2020-10-30 MED ORDER — SODIUM CHLORIDE 0.9 % IV SOLN: Freq: Once | INTRAVENOUS | Status: AC

## 2020-10-30 NOTE — Progress Notes (Signed)
Hematology and Oncology Follow Up Visit  Jared Burton [id number] Sep 14, 1965 55 y.o. 10/30/2020 10:13 AM [name], MDBadger, [name], MD   Principle Diagnosis: 55 year old with melanoma of the left lower extremity diagnosed and May 2022.  He was found to have T2N1 tumor.   Prior Therapy: He underwent a wide excision of the left thigh melanoma as well as a left inguinal lymph node dissection on July 26, 2020.  The final pathology showed no residual melanoma with 1 out of 10 lymph nodes showed metastatic disease.    Current therapy: Pembrolizumab 400 mg every 6 weeks started on September 18, 2020.  He is here for cycle 2 of therapy.  Interim History: Mr. Jared Burton returns today for a follow-up visit.  Since the last visit, he received the first treatment of Pembrolizumab without any major complications.  He denies any nausea, vomiting or abdominal pain.  He denies any hospitalizations or illnesses.  He denies any skin rashes or lesions.     Medications: I have reviewed the patient's current medications.  Current Outpatient Medications  Medication Sig Dispense Refill   doxycycline (MONODOX) 100 MG capsule Take 100 mg by mouth 2 (two) times daily. (Patient not taking: Reported on 09/18/2020)     IBUPROFEN PO Take by mouth. As needed     levocetirizine (XYZAL) 5 MG tablet Take 5 mg by mouth every evening.     prochlorperazine (COMPAZINE) 10 MG tablet Take 1 tablet (10 mg total) by mouth every 6 (six) hours as needed for nausea or vomiting. 30 tablet 0   No current facility-administered medications for this visit.     Allergies: No Known Allergies    Physical Exam: Blood pressure (!) 151/92, pulse 75, temperature 97.8 F (36.6 C), temperature source Oral, resp. rate 16, height 6\' 2"  (1.88 m), weight 222 lb 1.6 oz (100.7 kg), SpO2 98 %.  ECOG: 0 General appearance: alert and cooperative appeared without distress. Head: Normocephalic, without obvious abnormality Oropharynx: No oral  thrush or ulcers. Eyes: No scleral icterus.  Pupils are equal and round reactive to light. Lymph nodes: Cervical, supraclavicular, and axillary nodes normal. Heart:regular rate and rhythm, S1, S2 normal, no murmur, click, rub or gallop Lung:chest clear, no wheezing, rales, normal symmetric air entry Abdomin: soft, non-tender, without masses or organomegaly. Neurological: No motor, sensory deficits.  Intact deep tendon reflexes. Skin: No rashes or lesions.  No ecchymosis or petechiae. Musculoskeletal: No joint deformity or effusion.     Lab Results: Lab Results  Component Value Date   WBC 7.1 09/18/2020   HGB 15.4 09/18/2020   HCT 45.1 09/18/2020   MCV 84.3 09/18/2020   PLT 215 09/18/2020     Chemistry      Component Value Date/Time   NA 140 09/18/2020 1258   K 3.8 09/18/2020 1258   CL 106 09/18/2020 1258   CO2 26 09/18/2020 1258   BUN 14 09/18/2020 1258   CREATININE 1.05 09/18/2020 1258      Component Value Date/Time   CALCIUM 9.2 09/18/2020 1258   ALKPHOS 98 09/18/2020 1258   AST 13 (L) 09/18/2020 1258   ALT 14 09/18/2020 1258   BILITOT 0.8 09/18/2020 1258       Impression and Plan:  55 year old with:  1.  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  Risks and benefits of continuing this treatment long-term were reviewed.  Potential  complications include autoimmune issues and GI toxicity were discussed.  At this time he is agreeable to continue we will update his staging scans in January 2023.     2.  IV access: No issues reported and his peripheral veins.     3.  Immune mediated complications: He has not experienced any complications including pneumonitis, colitis and thyroid disease.     4.  Antiemetics: Compazine is available to him without any nausea or vomiting.   5.  Dermatology surveillance: I recommended continued dermatology  surveillance given his high risk of developing another melanoma.     6.  Follow-up: In 6 weeks for the start of next cycle of therapy.   60  minutes were spent on this encounter.  Time was dedicated to reviewing his disease status, treatment choices and future plan of care discussion.      Zola Button, MD 9/6/202210:13 AM

## 2020-10-30 NOTE — Patient Instructions (Signed)
[facility] ONCOLOGY  Discharge Instructions: Thank you for choosing [facility] to provide your oncology and hematology care.   If you have a lab appointment with the [facility], please go directly to the [facility] and check in at the registration area.   Wear comfortable clothing and clothing appropriate for easy access to any Portacath or PICC line.   We strive to give you quality time with your provider. You may need to reschedule your appointment if you arrive late (15 or more minutes).  Arriving late affects you and other patients whose appointments are after yours.  Also, if you miss three or more appointments without notifying the office, you may be dismissed from the clinic at the provider's discretion.      For prescription refill requests, have your pharmacy contact our office and allow 72 hours for refills to be completed.    Today you received the following chemotherapy and/or immunotherapy agents: Keytruda    To help prevent nausea and vomiting after your treatment, we encourage you to take your nausea medication as directed.  BELOW ARE SYMPTOMS THAT SHOULD BE REPORTED IMMEDIATELY: *FEVER GREATER THAN 100.4 F (38 C) OR HIGHER *CHILLS OR SWEATING *NAUSEA AND VOMITING THAT IS NOT CONTROLLED WITH YOUR NAUSEA MEDICATION *UNUSUAL SHORTNESS OF BREATH *UNUSUAL BRUISING OR BLEEDING *URINARY PROBLEMS (pain or burning when urinating, or frequent urination) *BOWEL PROBLEMS (unusual diarrhea, constipation, pain near the anus) TENDERNESS IN MOUTH AND THROAT WITH OR WITHOUT PRESENCE OF ULCERS (sore throat, sores in mouth, or a toothache) UNUSUAL RASH, SWELLING OR PAIN  UNUSUAL VAGINAL DISCHARGE OR ITCHING   Items with * indicate a potential emergency and should be followed up as soon as possible or go to the Emergency Department if any problems should occur.  Please show the CHEMOTHERAPY ALERT CARD or IMMUNOTHERAPY ALERT CARD at check-in to the  Emergency Department and triage nurse.  Should you have questions after your visit or need to cancel or reschedule your appointment, please contact [facility]  Dept: [phone]  and follow the prompts.  Office hours are 8:00 a.m. to 4:30 p.m. Monday - Friday. Please note that voicemails left after 4:00 p.m. may not be returned until the following business day.  We are closed weekends and major holidays. You have access to a nurse at all times for urgent questions. Please call the main number to the clinic Dept: [phone] and follow the prompts.   For any non-urgent questions, you may also contact your provider using MyChart. We now offer e-Visits for anyone 27 and older to request care online for non-urgent symptoms. For details visit [email or web address].   Also download the MyChart app! Go to the app store, search "MyChart", open the app, select [facility], and log in with your MyChart username and password.  Due to Covid, a mask is required upon entering the hospital/clinic. If you do not have a mask, one will be given to you upon arrival. For doctor visits, patients may have 1 support person aged 1 or older with them. For treatment visits, patients cannot have anyone with them due to current Covid guidelines and our immunocompromised population.   Pembrolizumab injection What is this medication? PEMBROLIZUMAB (pem broe liz ue mab) is a monoclonal antibody. It is used totreat certain types of cancer. This medicine may be used for other purposes; ask your health care provider orpharmacist if you have questions. COMMON BRAND NAME(S): Keytruda What should I tell my  care team before I take this medication? They need to know if you have any of these conditions: autoimmune diseases like Crohn's disease, ulcerative colitis, or lupus have had or planning to have an allogeneic stem cell transplant (uses someone else's stem cells) history of organ  transplant history of chest radiation nervous system problems like myasthenia gravis or Guillain-Barre syndrome an unusual or allergic reaction to pembrolizumab, other medicines, foods, dyes, or preservatives pregnant or trying to get pregnant breast-feeding How should I use this medication? This medicine is for infusion into a vein. It is given by a health careprofessional in a hospital or clinic setting. A special MedGuide will be given to you before each treatment. Be sure to readthis information carefully each time. Talk to your pediatrician regarding the use of this medicine in children. While this drug may be prescribed for children as young as 6 months for selectedconditions, precautions do apply. Overdosage: If you think you have taken too much of this medicine contact apoison control center or emergency room at once. NOTE: This medicine is only for you. Do not share this medicine with others. What if I miss a dose? It is important not to miss your dose. Call your doctor or health careprofessional if you are unable to keep an appointment. What may interact with this medication? Interactions have not been studied. This list may not describe all possible interactions. Give your health care provider a list of all the medicines, herbs, non-prescription drugs, or dietary supplements you use. Also tell them if you smoke, drink alcohol, or use illegaldrugs. Some items may interact with your medicine. What should I watch for while using this medication? Your condition will be monitored carefully while you are receiving thismedicine. You may need blood work done while you are taking this medicine. Do not become pregnant while taking this medicine or for 4 months after stopping it. Women should inform their doctor if they wish to become pregnant or think they might be pregnant. There is a potential for serious side effects to an unborn child. Talk to your health care professional or pharmacist for  more information. Do not breast-feed an infant while taking this medicine orfor 4 months after the last dose. What side effects may I notice from receiving this medication? Side effects that you should report to your doctor or health care professionalas soon as possible: allergic reactions like skin rash, itching or hives, swelling of the face, lips, or tongue bloody or black, tarry breathing problems changes in vision chest pain chills confusion constipation cough diarrhea dizziness or feeling faint or lightheaded fast or irregular heartbeat fever flushing joint pain low blood counts - this medicine may decrease the number of white blood cells, red blood cells and platelets. You may be at increased risk for infections and bleeding. muscle pain muscle weakness pain, tingling, numbness in the hands or feet persistent headache redness, blistering, peeling or loosening of the skin, including inside the mouth signs and symptoms of high blood sugar such as dizziness; dry mouth; dry skin; fruity breath; nausea; stomach pain; increased hunger or thirst; increased urination signs and symptoms of kidney injury like trouble passing urine or change in the amount of urine signs and symptoms of liver injury like dark urine, light-colored stools, loss of appetite, nausea, right upper belly pain, yellowing of the eyes or skin sweating swollen lymph nodes weight loss Side effects that usually do not require medical attention (report to yourdoctor or health care professional if they continue or are  bothersome): decreased appetite hair loss tiredness This list may not describe all possible side effects. Call your doctor for medical advice about side effects. You may report side effects to FDA at1-800-FDA-1088. Where should I keep my medication? This drug is given in a hospital or clinic and will not be stored at home. NOTE: This sheet is a summary. It may not cover all possible information. If you  have questions about this medicine, talk to your doctor, pharmacist, orhealth care provider.  2022 Elsevier/Gold Standard (2019-01-12 21:44:53)

## 2020-10-31 ENCOUNTER — Telehealth: Payer: Self-pay | Admitting: Oncology

## 2020-10-31 NOTE — Telephone Encounter (Signed)
Left message with follow-up appointment per 9/6 los.

## 2020-12-11 ENCOUNTER — Inpatient Hospital Stay (HOSPITAL_BASED_OUTPATIENT_CLINIC_OR_DEPARTMENT_OTHER): Payer: Federal, State, Local not specified - PPO | Admitting: Oncology

## 2020-12-11 ENCOUNTER — Other Ambulatory Visit: Payer: Self-pay

## 2020-12-11 ENCOUNTER — Inpatient Hospital Stay: Payer: Federal, State, Local not specified - PPO

## 2020-12-11 ENCOUNTER — Inpatient Hospital Stay: Payer: Federal, State, Local not specified - PPO | Attending: Oncology

## 2020-12-11 VITALS — BP 160/98 | HR 60 | Temp 97.9°F | Resp 18 | Ht 74.0 in | Wt 232.3 lb

## 2020-12-11 DIAGNOSIS — C439 Malignant melanoma of skin, unspecified: Secondary | ICD-10-CM

## 2020-12-11 DIAGNOSIS — Z79899 Other long term (current) drug therapy: Secondary | ICD-10-CM | POA: Diagnosis not present

## 2020-12-11 DIAGNOSIS — E032 Hypothyroidism due to medicaments and other exogenous substances: Secondary | ICD-10-CM

## 2020-12-11 DIAGNOSIS — C4372 Malignant melanoma of left lower limb, including hip: Secondary | ICD-10-CM | POA: Diagnosis present

## 2020-12-11 DIAGNOSIS — Z5112 Encounter for antineoplastic immunotherapy: Secondary | ICD-10-CM | POA: Diagnosis not present

## 2020-12-11 LAB — CBC WITH DIFFERENTIAL (CANCER CENTER ONLY)
Abs Immature Granulocytes: 0.01 10*3/uL (ref 0.00–0.07)
Basophils Absolute: 0 10*3/uL (ref 0.0–0.1)
Basophils Relative: 1 %
Eosinophils Absolute: 0.1 10*3/uL (ref 0.0–0.5)
Eosinophils Relative: 1 %
HCT: 48 % (ref 39.0–52.0)
Hemoglobin: 16.3 g/dL (ref 13.0–17.0)
Immature Granulocytes: 0 %
Lymphocytes Relative: 24 %
Lymphs Abs: 1.7 10*3/uL (ref 0.7–4.0)
MCH: 28.4 pg (ref 26.0–34.0)
MCHC: 34 g/dL (ref 30.0–36.0)
MCV: 83.6 fL (ref 80.0–100.0)
Monocytes Absolute: 0.5 10*3/uL (ref 0.1–1.0)
Monocytes Relative: 7 %
Neutro Abs: 4.8 10*3/uL (ref 1.7–7.7)
Neutrophils Relative %: 67 %
Platelet Count: 232 10*3/uL (ref 150–400)
RBC: 5.74 MIL/uL (ref 4.22–5.81)
RDW: 14.6 % (ref 11.5–15.5)
WBC Count: 7.1 10*3/uL (ref 4.0–10.5)
nRBC: 0 % (ref 0.0–0.2)

## 2020-12-11 LAB — TSH: TSH: 78.564 u[IU]/mL — ABNORMAL HIGH (ref 0.320–4.118)

## 2020-12-11 LAB — CMP (CANCER CENTER ONLY)
ALT: 33 U/L (ref 0–44)
AST: 40 U/L (ref 15–41)
Albumin: 3.8 g/dL (ref 3.5–5.0)
Alkaline Phosphatase: 94 U/L (ref 38–126)
Anion gap: 10 (ref 5–15)
BUN: 22 mg/dL — ABNORMAL HIGH (ref 6–20)
CO2: 23 mmol/L (ref 22–32)
Calcium: 9 mg/dL (ref 8.9–10.3)
Chloride: 104 mmol/L (ref 98–111)
Creatinine: 1.43 mg/dL — ABNORMAL HIGH (ref 0.61–1.24)
GFR, Estimated: 58 mL/min — ABNORMAL LOW (ref >60)
Glucose, Bld: 97 mg/dL (ref 70–99)
Potassium: 3.8 mmol/L (ref 3.5–5.1)
Sodium: 137 mmol/L (ref 135–145)
Total Bilirubin: 1.2 mg/dL (ref 0.3–1.2)
Total Protein: 6.7 g/dL (ref 6.5–8.1)

## 2020-12-11 MED ORDER — SODIUM CHLORIDE 0.9 % IV SOLN: Freq: Once | INTRAVENOUS | Status: AC

## 2020-12-11 MED ORDER — SODIUM CHLORIDE 0.9 % IV SOLN
400.0000 mg | Freq: Once | INTRAVENOUS | Status: AC
  Administered 2020-12-11: 400 mg via INTRAVENOUS
  Filled 2020-12-11: qty 16

## 2020-12-11 NOTE — Progress Notes (Addendum)
Hematology and Oncology Follow Up Visit  Jared Burton [id number] 28-Feb-1965 55 y.o. 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     Prior Therapy: He underwent a wide excision of the left thigh melanoma as well as a left inguinal lymph node dissection on July 26, 2020.  The final pathology showed no residual melanoma with 1 out of 10 lymph nodes showed metastatic disease.    Current therapy: Pembrolizumab 400 mg every 6 weeks started on September 18, 2020.  He is here for cycle 3 of therapy.  Interim History: Jared Burton is here for return evaluation.  Since the last visit, he reports no major changes in his health.  He has tolerated Pembrolizumab without any major complaints.  He denies any nausea, vomiting or abdominal pain.  He denies any recent hospitalizations or illnesses.  He denies any skin rashes or lesions.  He denies any changes in his bowel habits.     Medications: Updated on review. Current Outpatient Medications  Medication Sig Dispense Refill   doxycycline (MONODOX) 100 MG capsule Take 100 mg by mouth 2 (two) times daily. (Patient not taking: Reported on 09/18/2020)     IBUPROFEN PO Take by mouth. As needed     levocetirizine (XYZAL) 5 MG tablet Take 5 mg by mouth every evening.     prochlorperazine (COMPAZINE) 10 MG tablet Take 1 tablet (10 mg total) by mouth every 6 (six) hours as needed for nausea or vomiting. 30 tablet 0   No current facility-administered medications for this visit.     Allergies: No Known Allergies    Physical Exam: Blood pressure (!) 160/98, pulse 60, temperature 97.9 F (36.6 C), temperature source Oral, resp. rate 18, height 6\' 2"  (1.88 m), weight 232 lb 4.8 oz (105.4 kg), SpO2 99 %.   ECOG: 0    General appearance: Burton, awake without any distress. Head: Atraumatic without abnormalities Oropharynx: Without any  thrush or ulcers. Eyes: No scleral icterus. Lymph nodes: No lymphadenopathy noted in the cervical, supraclavicular, or axillary nodes Heart:regular rate and rhythm, without any murmurs or gallops.   Lung: Clear to auscultation without any rhonchi, wheezes or dullness to percussion. Abdomin: Soft, nontender without any shifting dullness or ascites. Musculoskeletal: No clubbing or cyanosis. Neurological: No motor or sensory deficits. Skin: No rashes or lesions.      Lab Results: Lab Results  Component Value Date   WBC 7.8 10/30/2020   HGB 15.2 10/30/2020   HCT 44.7 10/30/2020   MCV 82.0 10/30/2020   PLT 289 10/30/2020     Chemistry      Component Value Date/Time   NA 141 10/30/2020 0959   K 4.2 10/30/2020 0959   CL 109 10/30/2020 0959   CO2 23 10/30/2020 0959   BUN 18 10/30/2020 0959   CREATININE 1.09 10/30/2020 0959      Component Value Date/Time   CALCIUM 9.2 10/30/2020 0959   ALKPHOS 115 10/30/2020 0959   AST 16 10/30/2020 0959   ALT 30 10/30/2020 0959   BILITOT 0.7 10/30/2020 0959       Impression and Plan:  55 year old with:  1.  Melanoma of the left leg diagnosed in June 2022.  He was found to have T2aN1 disease.     His disease status was updated at this time and treatment choices were reviewed.  Risks and benefits of continuing Keytruda were reiterated.  Complications such as autoimmune issues, GI toxicity and dermatological manifestation were reviewed.  He is agreeable to continue with this treatment given his reasonable tolerance.  We will update his staging scans in January after the next cycle.     2.  IV access: Peripheral veins are currently in use without any issues.     3.  Immune mediated complications: Complications including pneumonitis, thyroiditis, hepatitis among others were reviewed.  He is not experiencing any at this time.     4.  Antiemetics: No nausea or vomiting reported.  Compazine is available to him.   5.  Dermatology  surveillance: He is currently following with dermatology and I recommended continuing this for the time being.     6.  Follow-up: He will return in 6 weeks for the next cycle of therapy.   30  minutes were dedicated to this encounter.  The time spent on reviewing laboratory data, disease status update, treatment choices and future plan of care review.      Jared Button, Burton 10/18/202211:40 AM  His TSH results were reviewed and discussed today with the patient.  It is likely he has developed hypothyroidism rather than hyperthyroidism.  I will obtain a thyroid panel including T4 levels to determine the exact replacement need.  We have talked about the Synthroid replacement which we will start upon completing the panel.  All his questions are answered today to his satisfaction.  Jared Burton 12/11/2020

## 2020-12-11 NOTE — Patient Instructions (Signed)
[facility]  Discharge Instructions: ?Thank you for choosing [facility] to provide your oncology and hematology care.  ? ?If you have a lab appointment with the Cancer Center, please go directly to the Cancer Center and check in at the registration area. ?  ?Wear comfortable clothing and clothing appropriate for easy access to any Portacath or PICC line.  ? ?We strive to give you quality time with your provider. You may need to reschedule your appointment if you arrive late (15 or more minutes).  Arriving late affects you and other patients whose appointments are after yours.  Also, if you miss three or more appointments without notifying the office, you may be dismissed from the clinic at the provider?s discretion.    ?  ?For prescription refill requests, have your pharmacy contact our office and allow 72 hours for refills to be completed.   ? ?Today you received the following chemotherapy and/or immunotherapy agents: Keytruda ?  ?To help prevent nausea and vomiting after your treatment, we encourage you to take your nausea medication as directed. ? ?BELOW ARE SYMPTOMS THAT SHOULD BE REPORTED IMMEDIATELY: ?*FEVER GREATER THAN 100.4 F (38 ?C) OR HIGHER ?*CHILLS OR SWEATING ?*NAUSEA AND VOMITING THAT IS NOT CONTROLLED WITH YOUR NAUSEA MEDICATION ?*UNUSUAL SHORTNESS OF BREATH ?*UNUSUAL BRUISING OR BLEEDING ?*URINARY PROBLEMS (pain or burning when urinating, or frequent urination) ?*BOWEL PROBLEMS (unusual diarrhea, constipation, pain near the anus) ?TENDERNESS IN MOUTH AND THROAT WITH OR WITHOUT PRESENCE OF ULCERS (sore throat, sores in mouth, or a toothache) ?UNUSUAL RASH, SWELLING OR PAIN  ?UNUSUAL VAGINAL DISCHARGE OR ITCHING  ? ?Items with * indicate a potential emergency and should be followed up as soon as possible or go to the Emergency Department if any problems should occur. ? ?Please show the CHEMOTHERAPY ALERT CARD or IMMUNOTHERAPY ALERT CARD at check-in to the  Emergency Department and triage nurse. ? ?Should you have questions after your visit or need to cancel or reschedule your appointment, please contact [facility]  Dept: [phone]  and follow the prompts.  Office hours are 8:00 a.m. to 4:30 p.m. Monday - Friday. Please note that voicemails left after 4:00 p.m. may not be returned until the following business day.  We are closed weekends and major holidays. You have access to a nurse at all times for urgent questions. Please call the main number to the clinic Dept: [phone] and follow the prompts. ? ? ?For any non-urgent questions, you may also contact your provider using MyChart. We now offer e-Visits for anyone 18 and older to request care online for non-urgent symptoms. For details visit [email or web address]. ?  ?Also download the MyChart app! Go to the app store, search "MyChart", open the app, select [facility], and log in with your MyChart username and password. ? ?Due to Covid, a mask is required upon entering the hospital/clinic. If you do not have a mask, one will be given to you upon arrival. For doctor visits, patients may have 1 support person aged 18 or older with them. For treatment visits, patients cannot have anyone with them due to current Covid guidelines and our immunocompromised population.  ? ?

## 2020-12-11 NOTE — Progress Notes (Signed)
Per Dr. Alen Blew - okay to proceed with treatment with elevated BP.

## 2020-12-11 NOTE — Addendum Note (Signed)
Addended by: Wyatt Portela on: 12/11/2020 01:15 PM   Modules accepted: Orders

## 2021-01-15 ENCOUNTER — Telehealth: Payer: Self-pay | Admitting: Oncology

## 2021-01-15 NOTE — Telephone Encounter (Signed)
Sch per 10/18 los, pt aware

## 2021-01-22 ENCOUNTER — Ambulatory Visit (INDEPENDENT_AMBULATORY_CARE_PROVIDER_SITE_OTHER): Payer: Federal, State, Local not specified - PPO | Admitting: Dermatology

## 2021-01-22 ENCOUNTER — Other Ambulatory Visit: Payer: Self-pay

## 2021-01-22 DIAGNOSIS — Z85828 Personal history of other malignant neoplasm of skin: Secondary | ICD-10-CM

## 2021-01-22 DIAGNOSIS — Z1283 Encounter for screening for malignant neoplasm of skin: Secondary | ICD-10-CM | POA: Diagnosis not present

## 2021-01-29 ENCOUNTER — Inpatient Hospital Stay (HOSPITAL_BASED_OUTPATIENT_CLINIC_OR_DEPARTMENT_OTHER): Payer: Federal, State, Local not specified - PPO | Admitting: Oncology

## 2021-01-29 ENCOUNTER — Other Ambulatory Visit: Payer: Self-pay

## 2021-01-29 ENCOUNTER — Telehealth: Payer: Self-pay | Admitting: *Deleted

## 2021-01-29 ENCOUNTER — Inpatient Hospital Stay: Payer: Federal, State, Local not specified - PPO

## 2021-01-29 ENCOUNTER — Inpatient Hospital Stay: Payer: Federal, State, Local not specified - PPO | Attending: Oncology

## 2021-01-29 VITALS — BP 157/110 | HR 74 | Temp 97.7°F | Resp 17 | Wt 235.6 lb

## 2021-01-29 DIAGNOSIS — C439 Malignant melanoma of skin, unspecified: Secondary | ICD-10-CM | POA: Diagnosis not present

## 2021-01-29 DIAGNOSIS — Z79899 Other long term (current) drug therapy: Secondary | ICD-10-CM | POA: Insufficient documentation

## 2021-01-29 DIAGNOSIS — C4372 Malignant melanoma of left lower limb, including hip: Secondary | ICD-10-CM | POA: Insufficient documentation

## 2021-01-29 DIAGNOSIS — Z5112 Encounter for antineoplastic immunotherapy: Secondary | ICD-10-CM | POA: Diagnosis present

## 2021-01-29 LAB — CMP (CANCER CENTER ONLY)
ALT: 76 U/L — ABNORMAL HIGH (ref 0–44)
AST: 89 U/L — ABNORMAL HIGH (ref 15–41)
Albumin: 4.4 g/dL (ref 3.5–5.0)
Alkaline Phosphatase: 75 U/L (ref 38–126)
Anion gap: 10 (ref 5–15)
BUN: 17 mg/dL (ref 6–20)
CO2: 25 mmol/L (ref 22–32)
Calcium: 8.9 mg/dL (ref 8.9–10.3)
Chloride: 104 mmol/L (ref 98–111)
Creatinine: 1.73 mg/dL — ABNORMAL HIGH (ref 0.61–1.24)
GFR, Estimated: 46 mL/min — ABNORMAL LOW (ref >60)
Glucose, Bld: 107 mg/dL — ABNORMAL HIGH (ref 70–99)
Potassium: 3.6 mmol/L (ref 3.5–5.1)
Sodium: 139 mmol/L (ref 135–145)
Total Bilirubin: 1.2 mg/dL (ref 0.3–1.2)
Total Protein: 7.1 g/dL (ref 6.5–8.1)

## 2021-01-29 LAB — CBC WITH DIFFERENTIAL (CANCER CENTER ONLY)
Abs Immature Granulocytes: 0 10*3/uL (ref 0.00–0.07)
Basophils Absolute: 0 10*3/uL (ref 0.0–0.1)
Basophils Relative: 1 %
Eosinophils Absolute: 0.2 10*3/uL (ref 0.0–0.5)
Eosinophils Relative: 3 %
HCT: 46.7 % (ref 39.0–52.0)
Hemoglobin: 16.1 g/dL (ref 13.0–17.0)
Immature Granulocytes: 0 %
Lymphocytes Relative: 31 %
Lymphs Abs: 1.9 10*3/uL (ref 0.7–4.0)
MCH: 29 pg (ref 26.0–34.0)
MCHC: 34.5 g/dL (ref 30.0–36.0)
MCV: 84.1 fL (ref 80.0–100.0)
Monocytes Absolute: 0.4 10*3/uL (ref 0.1–1.0)
Monocytes Relative: 6 %
Neutro Abs: 3.7 10*3/uL (ref 1.7–7.7)
Neutrophils Relative %: 59 %
Platelet Count: 210 10*3/uL (ref 150–400)
RBC: 5.55 MIL/uL (ref 4.22–5.81)
RDW: 14.8 % (ref 11.5–15.5)
WBC Count: 6.2 10*3/uL (ref 4.0–10.5)
nRBC: 0 % (ref 0.0–0.2)

## 2021-01-29 LAB — TSH: TSH: 111.742 u[IU]/mL — ABNORMAL HIGH (ref 0.320–4.118)

## 2021-01-29 MED ORDER — SODIUM CHLORIDE 0.9 % IV SOLN
400.0000 mg | Freq: Once | INTRAVENOUS | Status: AC
  Administered 2021-01-29: 400 mg via INTRAVENOUS
  Filled 2021-01-29: qty 16

## 2021-01-29 MED ORDER — SODIUM CHLORIDE 0.9 % IV SOLN: Freq: Once | INTRAVENOUS | Status: AC

## 2021-01-29 MED ORDER — LEVOTHYROXINE SODIUM 50 MCG PO TABS: 50.0000 ug | ORAL_TABLET | Freq: Every day | ORAL | 1 refills | Status: DC

## 2021-01-29 NOTE — Telephone Encounter (Signed)
-----   Message from Wyatt Portela, MD sent at 01/29/2021  2:46 PM EST ----- Please let him know to pick up his thyroid medication from his pharmacy like we discussed.  His labs indicate a low thyroid and need replacement.

## 2021-01-29 NOTE — Patient Instructions (Signed)
[facility]   Discharge Instructions: Thank you for choosing [facility] to provide your oncology and hematology care.   If you have a lab appointment with the [facility], please go directly to the [facility] and check in at the registration area.   Wear comfortable clothing and clothing appropriate for easy access to any Portacath or PICC line.   We strive to give you quality time with your provider. You may need to reschedule your appointment if you arrive late (15 or more minutes).  Arriving late affects you and other patients whose appointments are after yours.  Also, if you miss three or more appointments without notifying the office, you may be dismissed from the clinic at the provider's discretion.      For prescription refill requests, have your pharmacy contact our office and allow 72 hours for refills to be completed.    Today you received the following chemotherapy and/or immunotherapy agents: pembrolizumab.      To help prevent nausea and vomiting after your treatment, we encourage you to take your nausea medication as directed.  BELOW ARE SYMPTOMS THAT SHOULD BE REPORTED IMMEDIATELY: *FEVER GREATER THAN 100.4 F (38 C) OR HIGHER *CHILLS OR SWEATING *NAUSEA AND VOMITING THAT IS NOT CONTROLLED WITH YOUR NAUSEA MEDICATION *UNUSUAL SHORTNESS OF BREATH *UNUSUAL BRUISING OR BLEEDING *URINARY PROBLEMS (pain or burning when urinating, or frequent urination) *BOWEL PROBLEMS (unusual diarrhea, constipation, pain near the anus) TENDERNESS IN MOUTH AND THROAT WITH OR WITHOUT PRESENCE OF ULCERS (sore throat, sores in mouth, or a toothache) UNUSUAL RASH, SWELLING OR PAIN  UNUSUAL VAGINAL DISCHARGE OR ITCHING   Items with * indicate a potential emergency and should be followed up as soon as possible or go to the Emergency Department if any problems should occur.  Please show the CHEMOTHERAPY ALERT CARD or IMMUNOTHERAPY ALERT CARD at  check-in to the Emergency Department and triage nurse.  Should you have questions after your visit or need to cancel or reschedule your appointment, please contact [facility]  Dept: [phone]  and follow the prompts.  Office hours are 8:00 a.m. to 4:30 p.m. Monday - Friday. Please note that voicemails left after 4:00 p.m. may not be returned until the following business day.  We are closed weekends and major holidays. You have access to a nurse at all times for urgent questions. Please call the main number to the clinic Dept: [phone] and follow the prompts.   For any non-urgent questions, you may also contact your provider using MyChart. We now offer e-Visits for anyone 77 and older to request care online for non-urgent symptoms. For details visit [email or web address].   Also download the MyChart app! Go to the app store, search "MyChart", open the app, select [facility], and log in with your MyChart username and password.  Due to Covid, a mask is required upon entering the hospital/clinic. If you do not have a mask, one will be given to you upon arrival. For doctor visits, patients may have 1 support person aged 74 or older with them. For treatment visits, patients cannot have anyone with them due to current Covid guidelines and our immunocompromised population.

## 2021-01-29 NOTE — Progress Notes (Signed)
Ok to treat today with elevated liver enzymes and Scr per Dr. Alen Blew.

## 2021-01-29 NOTE — Telephone Encounter (Signed)
PC to patient, advised him of Dr. Hazeline Junker message as below - he verbalizes understanding.

## 2021-01-29 NOTE — Addendum Note (Signed)
Addended by: Wyatt Portela on: 01/29/2021 02:45 PM   Modules accepted: Orders

## 2021-01-29 NOTE — Progress Notes (Signed)
Hematology and Oncology Follow Up Visit  Jared Burton [id number] Mar 23, 1965 55 y.o. 01/29/2021 12:30 PM Jared Burton, MDBadger, Jared Alert, MD   Principle Diagnosis: 55 year old man with left lower extremity melanoma diagnosed and May 2022.  He presented with T2N1 tumor.   Prior Therapy: He underwent a wide excision of the left thigh melanoma as well as a left inguinal lymph node dissection on July 26, 2020.  The final pathology showed no residual melanoma with 1 out of 10 lymph nodes showed metastatic disease.    Current therapy: Pembrolizumab 400 mg every 6 weeks started on September 18, 2020.  He is here for cycle 4 of therapy.  Interim History: Jared Burton returns today for a follow-up visit.  Since the last visit, he reports a few complaints predominantly fatigue, tiredness and weight gain as well as some visual disturbances.  He denies any fevers or chills or sweats.  He denies any hospitalizations or illnesses.  He denies any nausea or vomiting.    Medications: Reviewed without changes. Current Outpatient Medications  Medication Sig Dispense Refill   acetaminophen (TYLENOL) 500 MG tablet Take by mouth.     ibuprofen (ADVIL) 200 MG tablet Take by mouth.     IBUPROFEN PO Take by mouth. As needed     levocetirizine (XYZAL) 5 MG tablet Take 5 mg by mouth every evening.     prochlorperazine (COMPAZINE) 10 MG tablet Take 1 tablet (10 mg total) by mouth every 6 (six) hours as needed for nausea or vomiting. 30 tablet 0   No current facility-administered medications for this visit.     Allergies: No Known Allergies    Physical Exam:  Blood pressure (!) 157/110, pulse 74, temperature 97.7 F (36.5 C), temperature source Tympanic, resp. rate 17, weight 235 lb 9.6 oz (106.9 kg), SpO2 95 %.   ECOG: 0   General appearance: Comfortable appearing without any discomfort Head: Normocephalic without any trauma Oropharynx: Mucous membranes are moist and pink without any thrush or  ulcers. Eyes: Pupils are equal and round reactive to light. Lymph nodes: No cervical, supraclavicular, inguinal or axillary lymphadenopathy.   Heart:regular rate and rhythm.  S1 and S2 without leg edema. Lung: Clear without any rhonchi or wheezes.  No dullness to percussion. Abdomin: Soft, nontender, nondistended with good bowel sounds.  No hepatosplenomegaly. Musculoskeletal: No joint deformity or effusion.  Full range of motion noted. Neurological: No deficits noted on motor, sensory and deep tendon reflex exam. Skin: No petechial rash or dryness.  Appeared moist.        Lab Results: Lab Results  Component Value Date   WBC 7.1 12/11/2020   HGB 16.3 12/11/2020   HCT 48.0 12/11/2020   MCV 83.6 12/11/2020   PLT 232 12/11/2020     Chemistry      Component Value Date/Time   NA 137 12/11/2020 1128   K 3.8 12/11/2020 1128   CL 104 12/11/2020 1128   CO2 23 12/11/2020 1128   BUN 22 (H) 12/11/2020 1128   CREATININE 1.43 (H) 12/11/2020 1128      Component Value Date/Time   CALCIUM 9.0 12/11/2020 1128   ALKPHOS 94 12/11/2020 1128   AST 40 12/11/2020 1128   ALT 33 12/11/2020 1128   BILITOT 1.2 12/11/2020 1128       Impression and Plan:  55 year old with:  1. T2aN1 melanoma of the lower extremity diagnosed in May 2022.       He has tolerated Pembrolizumab at the current dose and  schedule without any major complications.  Risks and benefits of continuing this treatment were discussed.  GI toxicity, dermatological issues as well as autoimmune complications were reviewed.  After discussion today, he is agreeable to proceed despite the moderate complications.  Therapy discontinuation may be considered if he has more complications.  We will update his staging scans before the next visit.     2.  IV access: No issues reported with his peripheral veins.     3.  Immune mediated complications: I continue to address these issues including pneumonitis, colitis, hepatitis and  dermatitis.     4.  Antiemetics: Compazine is available to him without any nausea or vomiting.   5.  Dermatology surveillance: No skin rashes or lesions noted.  Continues to follow with dermatology.  6.  Thyroid abnormalities: His thyroid panel will be repeated today and addressed accordingly.  His TSH went from very low to high level and will continue to monitor and address as needed.  Thyroid replacement may be required.  He is agreeable to proceed with Synthroid replacement after discussion today we will await his TSH results.     7.  Follow-up: In 6 weeks for repeat evaluation.   30  minutes were spent on this visit.  The time was dedicated to reviewing laboratory data, disease status update and addressing complications related to his cancer and cancer therapy.      Jared Button, MD 12/6/202212:30 PM

## 2021-02-09 ENCOUNTER — Encounter: Payer: Self-pay | Admitting: Dermatology

## 2021-02-09 NOTE — Progress Notes (Signed)
° °  Follow-Up Visit   Subjective  Jared Burton is a 55 y.o. male who presents for the following: Annual Exam (Here for annual skin exam. No concerns. History of non mole skin cancers. ).  General skin examination Location:  Duration:  Quality:  Associated Signs/Symptoms: Modifying Factors:  Severity:  Timing: Context:   Objective  Well appearing patient in no apparent distress; mood and affect are within normal limits. Left Upper Back Full body skin exams.  No atypical pigmented lesions, no new or recurrent nonmelanoma skin cancer.  Large number of moles with variability when examined by eye but all similar with dermoscopy.    A full examination was performed including scalp, head, eyes, ears, nose, lips, neck, chest, axillae, abdomen, back, buttocks, bilateral upper extremities, bilateral lower extremities, hands, feet, fingers, toes, fingernails, and toenails. All findings within normal limits unless otherwise noted below.   Assessment & Plan    Screening for malignant neoplasm of skin Left Upper Back  Follow up in 6 months.       I, Lavonna Monarch, MD, have reviewed all documentation for this visit.  The documentation on 02/09/21 for the exam, diagnosis, procedures, and orders are all accurate and complete.

## 2021-02-10 ENCOUNTER — Other Ambulatory Visit: Payer: Self-pay

## 2021-02-14 ENCOUNTER — Other Ambulatory Visit: Payer: Self-pay

## 2021-02-21 ENCOUNTER — Ambulatory Visit (HOSPITAL_BASED_OUTPATIENT_CLINIC_OR_DEPARTMENT_OTHER): Payer: Federal, State, Local not specified - PPO

## 2021-02-26 ENCOUNTER — Ambulatory Visit (HOSPITAL_BASED_OUTPATIENT_CLINIC_OR_DEPARTMENT_OTHER): Payer: Federal, State, Local not specified - PPO

## 2021-02-26 ENCOUNTER — Encounter (HOSPITAL_BASED_OUTPATIENT_CLINIC_OR_DEPARTMENT_OTHER): Payer: Self-pay

## 2021-02-26 ENCOUNTER — Ambulatory Visit (HOSPITAL_BASED_OUTPATIENT_CLINIC_OR_DEPARTMENT_OTHER)
Admission: RE | Admit: 2021-02-26 | Discharge: 2021-02-26 | Disposition: A | Discharge status: Home / Self Care | Payer: Federal, State, Local not specified - PPO | Source: Ambulatory Visit | Attending: Oncology | Admitting: Oncology

## 2021-02-26 ENCOUNTER — Other Ambulatory Visit: Payer: Self-pay

## 2021-02-26 DIAGNOSIS — C439 Malignant melanoma of skin, unspecified: Secondary | ICD-10-CM | POA: Insufficient documentation

## 2021-02-26 MED ORDER — IOHEXOL 300 MG/ML  SOLN
85.0000 mL | Freq: Once | INTRAMUSCULAR | Status: AC | PRN
  Administered 2021-02-26: 85 mL via INTRAVENOUS

## 2021-03-12 ENCOUNTER — Inpatient Hospital Stay: Payer: Federal, State, Local not specified - PPO | Admitting: Oncology

## 2021-03-12 ENCOUNTER — Other Ambulatory Visit: Payer: Self-pay

## 2021-03-12 ENCOUNTER — Inpatient Hospital Stay: Payer: Federal, State, Local not specified - PPO

## 2021-03-12 ENCOUNTER — Inpatient Hospital Stay: Payer: Federal, State, Local not specified - PPO | Attending: Oncology

## 2021-03-12 VITALS — BP 149/95 | HR 73 | Temp 97.7°F | Resp 17 | Ht 74.0 in | Wt 227.8 lb

## 2021-03-12 DIAGNOSIS — Z5112 Encounter for antineoplastic immunotherapy: Secondary | ICD-10-CM | POA: Diagnosis present

## 2021-03-12 DIAGNOSIS — C4372 Malignant melanoma of left lower limb, including hip: Secondary | ICD-10-CM | POA: Insufficient documentation

## 2021-03-12 DIAGNOSIS — E032 Hypothyroidism due to medicaments and other exogenous substances: Secondary | ICD-10-CM

## 2021-03-12 DIAGNOSIS — C430 Malignant melanoma of lip: Secondary | ICD-10-CM

## 2021-03-12 DIAGNOSIS — C439 Malignant melanoma of skin, unspecified: Secondary | ICD-10-CM

## 2021-03-12 DIAGNOSIS — E039 Hypothyroidism, unspecified: Secondary | ICD-10-CM | POA: Insufficient documentation

## 2021-03-12 LAB — CBC WITH DIFFERENTIAL (CANCER CENTER ONLY)
Abs Immature Granulocytes: 0.01 10*3/uL (ref 0.00–0.07)
Basophils Absolute: 0.1 10*3/uL (ref 0.0–0.1)
Basophils Relative: 1 %
Eosinophils Absolute: 0.2 10*3/uL (ref 0.0–0.5)
Eosinophils Relative: 3 %
HCT: 43.5 % (ref 39.0–52.0)
Hemoglobin: 15 g/dL (ref 13.0–17.0)
Immature Granulocytes: 0 %
Lymphocytes Relative: 25 %
Lymphs Abs: 1.4 10*3/uL (ref 0.7–4.0)
MCH: 30.1 pg (ref 26.0–34.0)
MCHC: 34.5 g/dL (ref 30.0–36.0)
MCV: 87.3 fL (ref 80.0–100.0)
Monocytes Absolute: 0.3 10*3/uL (ref 0.1–1.0)
Monocytes Relative: 6 %
Neutro Abs: 3.7 10*3/uL (ref 1.7–7.7)
Neutrophils Relative %: 65 %
Platelet Count: 229 10*3/uL (ref 150–400)
RBC: 4.98 MIL/uL (ref 4.22–5.81)
RDW: 13.2 % (ref 11.5–15.5)
WBC Count: 5.7 10*3/uL (ref 4.0–10.5)
nRBC: 0 % (ref 0.0–0.2)

## 2021-03-12 LAB — CMP (CANCER CENTER ONLY)
ALT: 16 U/L (ref 0–44)
AST: 20 U/L (ref 15–41)
Albumin: 4.5 g/dL (ref 3.5–5.0)
Alkaline Phosphatase: 67 U/L (ref 38–126)
Anion gap: 10 (ref 5–15)
BUN: 20 mg/dL (ref 6–20)
CO2: 24 mmol/L (ref 22–32)
Calcium: 9.4 mg/dL (ref 8.9–10.3)
Chloride: 103 mmol/L (ref 98–111)
Creatinine: 1.37 mg/dL — ABNORMAL HIGH (ref 0.61–1.24)
GFR, Estimated: >60 mL/min (ref >60)
Glucose, Bld: 164 mg/dL — ABNORMAL HIGH (ref 70–99)
Potassium: 3.7 mmol/L (ref 3.5–5.1)
Sodium: 137 mmol/L (ref 135–145)
Total Bilirubin: 1 mg/dL (ref 0.3–1.2)
Total Protein: 6.9 g/dL (ref 6.5–8.1)

## 2021-03-12 MED ORDER — SODIUM CHLORIDE 0.9 % IV SOLN: Freq: Once | INTRAVENOUS | Status: AC

## 2021-03-12 MED ORDER — SODIUM CHLORIDE 0.9 % IV SOLN
400.0000 mg | Freq: Once | INTRAVENOUS | Status: AC
  Administered 2021-03-12: 400 mg via INTRAVENOUS
  Filled 2021-03-12: qty 16

## 2021-03-12 MED ORDER — PROCHLORPERAZINE MALEATE 10 MG PO TABS: 10.0000 mg | ORAL_TABLET | Freq: Four times a day (QID) | ORAL | 3 refills | Status: DC | PRN

## 2021-03-12 NOTE — Patient Instructions (Signed)
[facility]  Discharge Instructions: ?Thank you for choosing [facility] to provide your oncology and hematology care.  ? ?If you have a lab appointment with the Cancer Center, please go directly to the Cancer Center and check in at the registration area. ?  ?Wear comfortable clothing and clothing appropriate for easy access to any Portacath or PICC line.  ? ?We strive to give you quality time with your provider. You may need to reschedule your appointment if you arrive late (15 or more minutes).  Arriving late affects you and other patients whose appointments are after yours.  Also, if you miss three or more appointments without notifying the office, you may be dismissed from the clinic at the provider?s discretion.    ?  ?For prescription refill requests, have your pharmacy contact our office and allow 72 hours for refills to be completed.   ? ?Today you received the following chemotherapy and/or immunotherapy agents: Keytruda ?  ?To help prevent nausea and vomiting after your treatment, we encourage you to take your nausea medication as directed. ? ?BELOW ARE SYMPTOMS THAT SHOULD BE REPORTED IMMEDIATELY: ?*FEVER GREATER THAN 100.4 F (38 ?C) OR HIGHER ?*CHILLS OR SWEATING ?*NAUSEA AND VOMITING THAT IS NOT CONTROLLED WITH YOUR NAUSEA MEDICATION ?*UNUSUAL SHORTNESS OF BREATH ?*UNUSUAL BRUISING OR BLEEDING ?*URINARY PROBLEMS (pain or burning when urinating, or frequent urination) ?*BOWEL PROBLEMS (unusual diarrhea, constipation, pain near the anus) ?TENDERNESS IN MOUTH AND THROAT WITH OR WITHOUT PRESENCE OF ULCERS (sore throat, sores in mouth, or a toothache) ?UNUSUAL RASH, SWELLING OR PAIN  ?UNUSUAL VAGINAL DISCHARGE OR ITCHING  ? ?Items with * indicate a potential emergency and should be followed up as soon as possible or go to the Emergency Department if any problems should occur. ? ?Please show the CHEMOTHERAPY ALERT CARD or IMMUNOTHERAPY ALERT CARD at check-in to the  Emergency Department and triage nurse. ? ?Should you have questions after your visit or need to cancel or reschedule your appointment, please contact [facility]  Dept: [phone]  and follow the prompts.  Office hours are 8:00 a.m. to 4:30 p.m. Monday - Friday. Please note that voicemails left after 4:00 p.m. may not be returned until the following business day.  We are closed weekends and major holidays. You have access to a nurse at all times for urgent questions. Please call the main number to the clinic Dept: [phone] and follow the prompts. ? ? ?For any non-urgent questions, you may also contact your provider using MyChart. We now offer e-Visits for anyone 18 and older to request care online for non-urgent symptoms. For details visit [email or web address]. ?  ?Also download the MyChart app! Go to the app store, search "MyChart", open the app, select [facility], and log in with your MyChart username and password. ? ?Due to Covid, a mask is required upon entering the hospital/clinic. If you do not have a mask, one will be given to you upon arrival. For doctor visits, patients may have 1 support person aged 18 or older with them. For treatment visits, patients cannot have anyone with them due to current Covid guidelines and our immunocompromised population.  ? ?

## 2021-03-12 NOTE — Progress Notes (Signed)
Hematology and Oncology Follow Up Visit  Jared Burton [id number] [birth year] 56 y.o. 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     Prior Therapy: He underwent a wide excision of the left thigh melanoma as well as a left inguinal lymph node dissection on July 26, 2020.  The final pathology showed no residual melanoma with 1 out of 10 lymph nodes showed metastatic disease.    Current therapy: Pembrolizumab 400 mg every 6 weeks started on September 18, 2020.  He is here for cycle 5 of therapy.  Interim History: Jared Burton presents today for a follow-up visit.  Since the last visit, he reports no major changes in his health.  He continues to improve with Synthroid replacement with improvement in his overall performance and quality of life.  He has lost weight and noted decrease in his lethargy and improvement in his energy.   Medications: Updated on review. Current Outpatient Medications  Medication Sig Dispense Refill   acetaminophen (TYLENOL) 500 MG tablet Take by mouth.     ibuprofen (ADVIL) 200 MG tablet Take by mouth.     IBUPROFEN PO Take by mouth. As needed     levocetirizine (XYZAL) 5 MG tablet Take 5 mg by mouth every evening.     levothyroxine (SYNTHROID) 50 MCG tablet Take 1 tablet (50 mcg total) by mouth daily before breakfast. 60 tablet 1   prochlorperazine (COMPAZINE) 10 MG tablet Take 1 tablet (10 mg total) by mouth every 6 (six) hours as needed for nausea or vomiting. 30 tablet 0   No current facility-administered medications for this visit.     Allergies: No Known Allergies    Physical Exam:     ECOG: 0    General appearance: Burton, awake without any distress. Head: Atraumatic without abnormalities Oropharynx: Without any thrush or ulcers. Eyes: No scleral icterus. Lymph nodes: No lymphadenopathy noted in the cervical, supraclavicular, or  axillary nodes Heart:regular rate and rhythm, without any murmurs or gallops.   Lung: Clear to auscultation without any rhonchi, wheezes or dullness to percussion. Abdomin: Soft, nontender without any shifting dullness or ascites. Musculoskeletal: No clubbing or cyanosis. Neurological: No motor or sensory deficits. Skin: No rashes or lesions.        Lab Results: Lab Results  Component Value Date   WBC 5.7 03/12/2021   HGB 15.0 03/12/2021   HCT 43.5 03/12/2021   MCV 87.3 03/12/2021   PLT 229 03/12/2021     Chemistry      Component Value Date/Time   NA 139 01/29/2021 1230   K 3.6 01/29/2021 1230   CL 104 01/29/2021 1230   CO2 25 01/29/2021 1230   BUN 17 01/29/2021 1230   CREATININE 1.73 (H) 01/29/2021 1230      Component Value Date/Time   CALCIUM 8.9 01/29/2021 1230   ALKPHOS 75 01/29/2021 1230   AST 89 (H) 01/29/2021 1230   ALT 76 (H) 01/29/2021 1230   BILITOT 1.2 01/29/2021 1230       Impression and Plan:  56 year old with:  1.  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  He was found to have T2aN1 disease.     His disease status was updated at this time and treatment choices were reviewed.  CT scan obtained on February 26, 2021 was personally reviewed and discussed with the patient.  No clear evidence of metastatic disease at  this time noted.  I recommended continuing Pembrolizumab at this time.  Complication occluding immune mediated issues and GI toxicity were reviewed and he is agreeable to continue.  The plan is to complete 1 year of therapy.     2.  IV access: No issues reported with his peripheral veins.  Port-A-Cath option has been deferred.     3.  Immune mediated complications: Other than thyroid disease he has not experienced any issues at this time.  Colitis pneumonitis and hepatitis were reiterated.     4.  Antiemetics: No nausea or vomiting at this time noted.  Compazine is available to him.   5.  Dermatology surveillance:  I recommended continued dermatology surveillance at this time given his high risk of another melanoma.  6.  Hypothyroidism: He is currently on Synthroid with improvement in his symptoms.  TSH is currently pending.  7.  Pulmonary nodules: Etiology is unclear and further monitoring is recommended.  We will repeat imaging studies in 6 months as a part of his melanoma screening.     8.  Follow-up: He will return in 6 weeks for the next cycle of therapy.   30  minutes were dedicated to this encounter.  The time was spent on reviewing laboratory data, disease status update and outlining future plan of care discussion.      Jared Button, MD 1/17/202311:16 AM

## 2021-03-13 LAB — THYROID PANEL WITH TSH
Free Thyroxine Index: 0.7 — ABNORMAL LOW (ref 1.2–4.9)
T3 Uptake Ratio: 19 % — ABNORMAL LOW (ref 24–39)
T4, Total: 3.9 ug/dL — ABNORMAL LOW (ref 4.5–12.0)
TSH: 93.9 u[IU]/mL — ABNORMAL HIGH (ref 0.450–4.500)

## 2021-03-25 ENCOUNTER — Other Ambulatory Visit: Payer: Self-pay | Admitting: Oncology

## 2021-04-23 ENCOUNTER — Inpatient Hospital Stay: Payer: Federal, State, Local not specified - PPO | Admitting: Oncology

## 2021-04-23 ENCOUNTER — Inpatient Hospital Stay: Payer: Federal, State, Local not specified - PPO | Attending: Oncology

## 2021-04-23 ENCOUNTER — Other Ambulatory Visit: Payer: Self-pay

## 2021-04-23 ENCOUNTER — Inpatient Hospital Stay: Payer: Federal, State, Local not specified - PPO

## 2021-04-23 VITALS — BP 153/99 | HR 66 | Temp 97.7°F | Resp 17 | Wt 225.4 lb

## 2021-04-23 DIAGNOSIS — C439 Malignant melanoma of skin, unspecified: Secondary | ICD-10-CM

## 2021-04-23 DIAGNOSIS — Z79899 Other long term (current) drug therapy: Secondary | ICD-10-CM | POA: Insufficient documentation

## 2021-04-23 DIAGNOSIS — Z5112 Encounter for antineoplastic immunotherapy: Secondary | ICD-10-CM | POA: Insufficient documentation

## 2021-04-23 DIAGNOSIS — C430 Malignant melanoma of lip: Secondary | ICD-10-CM | POA: Diagnosis not present

## 2021-04-23 DIAGNOSIS — C4372 Malignant melanoma of left lower limb, including hip: Secondary | ICD-10-CM | POA: Insufficient documentation

## 2021-04-23 LAB — CBC WITH DIFFERENTIAL (CANCER CENTER ONLY)
Abs Immature Granulocytes: 0.02 10*3/uL (ref 0.00–0.07)
Basophils Absolute: 0 10*3/uL (ref 0.0–0.1)
Basophils Relative: 0 %
Eosinophils Absolute: 0.1 10*3/uL (ref 0.0–0.5)
Eosinophils Relative: 1 %
HCT: 45.2 % (ref 39.0–52.0)
Hemoglobin: 15.5 g/dL (ref 13.0–17.0)
Immature Granulocytes: 0 %
Lymphocytes Relative: 14 %
Lymphs Abs: 1.3 10*3/uL (ref 0.7–4.0)
MCH: 30.6 pg (ref 26.0–34.0)
MCHC: 34.3 g/dL (ref 30.0–36.0)
MCV: 89.2 fL (ref 80.0–100.0)
Monocytes Absolute: 0.5 10*3/uL (ref 0.1–1.0)
Monocytes Relative: 5 %
Neutro Abs: 7.6 10*3/uL (ref 1.7–7.7)
Neutrophils Relative %: 80 %
Platelet Count: 210 10*3/uL (ref 150–400)
RBC: 5.07 MIL/uL (ref 4.22–5.81)
RDW: 12.7 % (ref 11.5–15.5)
WBC Count: 9.5 10*3/uL (ref 4.0–10.5)
nRBC: 0 % (ref 0.0–0.2)

## 2021-04-23 LAB — CMP (CANCER CENTER ONLY)
ALT: 13 U/L (ref 0–44)
AST: 19 U/L (ref 15–41)
Albumin: 4.3 g/dL (ref 3.5–5.0)
Alkaline Phosphatase: 81 U/L (ref 38–126)
Anion gap: 8 (ref 5–15)
BUN: 19 mg/dL (ref 6–20)
CO2: 26 mmol/L (ref 22–32)
Calcium: 9.2 mg/dL (ref 8.9–10.3)
Chloride: 104 mmol/L (ref 98–111)
Creatinine: 1.32 mg/dL — ABNORMAL HIGH (ref 0.61–1.24)
GFR, Estimated: >60 mL/min (ref >60)
Glucose, Bld: 149 mg/dL — ABNORMAL HIGH (ref 70–99)
Potassium: 3.8 mmol/L (ref 3.5–5.1)
Sodium: 138 mmol/L (ref 135–145)
Total Bilirubin: 0.8 mg/dL (ref 0.3–1.2)
Total Protein: 6.8 g/dL (ref 6.5–8.1)

## 2021-04-23 LAB — TSH: TSH: 71.072 u[IU]/mL — ABNORMAL HIGH (ref 0.320–4.118)

## 2021-04-23 MED ORDER — SODIUM CHLORIDE 0.9 % IV SOLN
400.0000 mg | Freq: Once | INTRAVENOUS | Status: AC
  Administered 2021-04-23: 400 mg via INTRAVENOUS
  Filled 2021-04-23: qty 16

## 2021-04-23 MED ORDER — SODIUM CHLORIDE 0.9 % IV SOLN: Freq: Once | INTRAVENOUS | Status: AC

## 2021-04-23 NOTE — Patient Instructions (Signed)
[facility]  Discharge Instructions: ?Thank you for choosing [facility] to provide your oncology and hematology care.  ? ?If you have a lab appointment with the Cancer Center, please go directly to the Cancer Center and check in at the registration area. ?  ?Wear comfortable clothing and clothing appropriate for easy access to any Portacath or PICC line.  ? ?We strive to give you quality time with your provider. You may need to reschedule your appointment if you arrive late (15 or more minutes).  Arriving late affects you and other patients whose appointments are after yours.  Also, if you miss three or more appointments without notifying the office, you may be dismissed from the clinic at the provider?s discretion.    ?  ?For prescription refill requests, have your pharmacy contact our office and allow 72 hours for refills to be completed.   ? ?Today you received the following chemotherapy and/or immunotherapy agents: Keytruda ?  ?To help prevent nausea and vomiting after your treatment, we encourage you to take your nausea medication as directed. ? ?BELOW ARE SYMPTOMS THAT SHOULD BE REPORTED IMMEDIATELY: ?*FEVER GREATER THAN 100.4 F (38 ?C) OR HIGHER ?*CHILLS OR SWEATING ?*NAUSEA AND VOMITING THAT IS NOT CONTROLLED WITH YOUR NAUSEA MEDICATION ?*UNUSUAL SHORTNESS OF BREATH ?*UNUSUAL BRUISING OR BLEEDING ?*URINARY PROBLEMS (pain or burning when urinating, or frequent urination) ?*BOWEL PROBLEMS (unusual diarrhea, constipation, pain near the anus) ?TENDERNESS IN MOUTH AND THROAT WITH OR WITHOUT PRESENCE OF ULCERS (sore throat, sores in mouth, or a toothache) ?UNUSUAL RASH, SWELLING OR PAIN  ?UNUSUAL VAGINAL DISCHARGE OR ITCHING  ? ?Items with * indicate a potential emergency and should be followed up as soon as possible or go to the Emergency Department if any problems should occur. ? ?Please show the CHEMOTHERAPY ALERT CARD or IMMUNOTHERAPY ALERT CARD at check-in to the  Emergency Department and triage nurse. ? ?Should you have questions after your visit or need to cancel or reschedule your appointment, please contact [facility]  Dept: [phone]  and follow the prompts.  Office hours are 8:00 a.m. to 4:30 p.m. Monday - Friday. Please note that voicemails left after 4:00 p.m. may not be returned until the following business day.  We are closed weekends and major holidays. You have access to a nurse at all times for urgent questions. Please call the main number to the clinic Dept: [phone] and follow the prompts. ? ? ?For any non-urgent questions, you may also contact your provider using MyChart. We now offer e-Visits for anyone 18 and older to request care online for non-urgent symptoms. For details visit [email or web address]. ?  ?Also download the MyChart app! Go to the app store, search "MyChart", open the app, select , and log in with your MyChart username and password. ? ?Due to Covid, a mask is required upon entering the hospital/clinic. If you do not have a mask, one will be given to you upon arrival. For doctor visits, patients may have 1 support person aged 18 or older with them. For treatment visits, patients cannot have anyone with them due to current Covid guidelines and our immunocompromised population.  ? ?

## 2021-04-23 NOTE — Progress Notes (Signed)
Hematology and Oncology Follow Up Visit  Jared Burton [id number] 08/13/1965 56 y.o. 04/23/2021 11:33 AM Jared Burton, MDBadger, [name], MD   Principle Diagnosis: 56 year old man with melanoma of the left lower extremity presented with a T2N1 disease in May 2022.     Prior Therapy: He underwent a wide excision of the left thigh melanoma as well as a left inguinal lymph node dissection on July 26, 2020.  The final pathology showed no residual melanoma with 1 out of 10 lymph nodes showed metastatic disease.    Current therapy: Pembrolizumab 400 mg every 6 weeks started on September 18, 2020.  He is here for cycle 6 of therapy.  Interim History: Jared Burton returns today for repeat evaluation.  Since her last visit, he reports feeling well without any major complaints.  He reports improvement in his energy level and performance status after thyroid replacement.  He still has element of fatigue and tiredness but overall able to perform activities of daily living without any decline.  He denies skin rash lesions or difficulty breathing.  He denies any cough or wheezing.  His performance status and quality of life remains unchanged.   Medications: Reviewed without changes. Current Outpatient Medications  Medication Sig Dispense Refill   acetaminophen (TYLENOL) 500 MG tablet Take by mouth.     ibuprofen (ADVIL) 200 MG tablet Take by mouth.     IBUPROFEN PO Take by mouth. As needed     levocetirizine (XYZAL) 5 MG tablet Take 5 mg by mouth every evening.     levothyroxine (SYNTHROID) 50 MCG tablet TAKE 1 TABLET(50 MCG) BY MOUTH DAILY BEFORE AND BREAKFAST 90 tablet 3   prochlorperazine (COMPAZINE) 10 MG tablet Take 1 tablet (10 mg total) by mouth every 6 (six) hours as needed for nausea or vomiting. 30 tablet 3   No current facility-administered medications for this visit.     Allergies: No Known Allergies    Physical Exam:   Blood pressure (!) 153/99, pulse 66, temperature 97.7 F (36.5  C), temperature source Temporal, resp. rate 17, weight 225 lb 6.4 oz (102.2 kg), SpO2 96 %.   ECOG: 0     General appearance: Comfortable appearing without any discomfort Head: Normocephalic without any trauma Oropharynx: Mucous membranes are moist and pink without any thrush or ulcers. Eyes: Pupils are equal and round reactive to light. Lymph nodes: No cervical, supraclavicular, inguinal or axillary lymphadenopathy.   Heart:regular rate and rhythm.  S1 and S2 without leg edema. Lung: Clear without any rhonchi or wheezes.  No dullness to percussion. Abdomin: Soft, nontender, nondistended with good bowel sounds.  No hepatosplenomegaly. Musculoskeletal: No joint deformity or effusion.  Full range of motion noted. Neurological: No deficits noted on motor, sensory and deep tendon reflex exam. Skin: No petechial rash or dryness.  Appeared moist.           Lab Results: Lab Results  Component Value Date   WBC 9.5 04/23/2021   HGB 15.5 04/23/2021   HCT 45.2 04/23/2021   MCV 89.2 04/23/2021   PLT 210 04/23/2021     Chemistry      Component Value Date/Time   NA 137 03/12/2021 1100   K 3.7 03/12/2021 1100   CL 103 03/12/2021 1100   CO2 24 03/12/2021 1100   BUN 20 03/12/2021 1100   CREATININE 1.37 (H) 03/12/2021 1100      Component Value Date/Time   CALCIUM 9.4 03/12/2021 1100   ALKPHOS 67 03/12/2021 1100   AST  20 03/12/2021 1100   ALT 16 03/12/2021 1100   BILITOT 1.0 03/12/2021 1100       Impression and Plan:  56 year old with:  1.  T2aN1 cutaneous melanoma of the lower extremity diagnosed in May 2022.       He is currently on adjuvant Pembrolizumab without any major complications.  Risks and benefits of continuing this treatment were discussed at this time.  Potential complications include nausea, fatigue and autoimmune issues were reiterated.  Plan is to complete 1 year of therapy and update his staging scan in July 2023.     2.  IV access: Peripheral veins  remains in use at this time without any issues.     3.  Immune mediated complications: I continue to educate him about additional complications including pneumonitis, hepatitis, hypophysitis among others.     4.  Antiemetics: No nausea or vomiting reported at this time.  Compazine is available to him.   5.  Dermatology surveillance: He continues to follow with dermatology regarding this issue.  I recommended continuing that.  6.  Hypothyroidism: He is currently on Synthroid replacement and will continue to monitor TSH and adjust the regimen.  7.  Pulmonary nodules: He will have repeat imaging studies in 4 months.  No evidence of malignancy at this time.     8.  Follow-up: In 6 weeks for repeat follow-up..   30  minutes were spent on this visit.  The time was dedicated to reviewing laboratory data, disease status update and addressing complication related to his diagnosis and therapy.      Zola Button, MD 2/28/202311:33 AM

## 2021-05-20 ENCOUNTER — Encounter: Payer: Self-pay | Admitting: Oncology

## 2021-06-04 ENCOUNTER — Other Ambulatory Visit: Payer: Federal, State, Local not specified - PPO

## 2021-06-04 ENCOUNTER — Ambulatory Visit: Payer: Federal, State, Local not specified - PPO | Admitting: Oncology

## 2021-06-04 ENCOUNTER — Ambulatory Visit: Payer: Federal, State, Local not specified - PPO

## 2021-06-11 ENCOUNTER — Other Ambulatory Visit: Payer: Self-pay

## 2021-06-11 ENCOUNTER — Inpatient Hospital Stay: Payer: Federal, State, Local not specified - PPO

## 2021-06-11 ENCOUNTER — Inpatient Hospital Stay: Payer: Federal, State, Local not specified - PPO | Attending: Oncology

## 2021-06-11 ENCOUNTER — Inpatient Hospital Stay (HOSPITAL_BASED_OUTPATIENT_CLINIC_OR_DEPARTMENT_OTHER): Payer: Federal, State, Local not specified - PPO | Admitting: Oncology

## 2021-06-11 VITALS — BP 170/98 | HR 69 | Temp 98.1°F | Resp 17 | Ht 74.0 in | Wt 227.7 lb

## 2021-06-11 DIAGNOSIS — Z79899 Other long term (current) drug therapy: Secondary | ICD-10-CM | POA: Insufficient documentation

## 2021-06-11 DIAGNOSIS — C439 Malignant melanoma of skin, unspecified: Secondary | ICD-10-CM

## 2021-06-11 DIAGNOSIS — Z5112 Encounter for antineoplastic immunotherapy: Secondary | ICD-10-CM | POA: Diagnosis present

## 2021-06-11 DIAGNOSIS — C4372 Malignant melanoma of left lower limb, including hip: Secondary | ICD-10-CM | POA: Diagnosis present

## 2021-06-11 LAB — CMP (CANCER CENTER ONLY)
ALT: 10 U/L (ref 0–44)
AST: 16 U/L (ref 15–41)
Albumin: 4.3 g/dL (ref 3.5–5.0)
Alkaline Phosphatase: 72 U/L (ref 38–126)
Anion gap: 8 (ref 5–15)
BUN: 23 mg/dL — ABNORMAL HIGH (ref 6–20)
CO2: 26 mmol/L (ref 22–32)
Calcium: 9.1 mg/dL (ref 8.9–10.3)
Chloride: 104 mmol/L (ref 98–111)
Creatinine: 1.37 mg/dL — ABNORMAL HIGH (ref 0.61–1.24)
GFR, Estimated: >60 mL/min (ref >60)
Glucose, Bld: 132 mg/dL — ABNORMAL HIGH (ref 70–99)
Potassium: 3.9 mmol/L (ref 3.5–5.1)
Sodium: 138 mmol/L (ref 135–145)
Total Bilirubin: 0.8 mg/dL (ref 0.3–1.2)
Total Protein: 6.8 g/dL (ref 6.5–8.1)

## 2021-06-11 LAB — CBC WITH DIFFERENTIAL (CANCER CENTER ONLY)
Abs Immature Granulocytes: 0.01 10*3/uL (ref 0.00–0.07)
Basophils Absolute: 0 10*3/uL (ref 0.0–0.1)
Basophils Relative: 1 %
Eosinophils Absolute: 0.1 10*3/uL (ref 0.0–0.5)
Eosinophils Relative: 2 %
HCT: 45.8 % (ref 39.0–52.0)
Hemoglobin: 15.6 g/dL (ref 13.0–17.0)
Immature Granulocytes: 0 %
Lymphocytes Relative: 27 %
Lymphs Abs: 1.6 10*3/uL (ref 0.7–4.0)
MCH: 29.8 pg (ref 26.0–34.0)
MCHC: 34.1 g/dL (ref 30.0–36.0)
MCV: 87.6 fL (ref 80.0–100.0)
Monocytes Absolute: 0.4 10*3/uL (ref 0.1–1.0)
Monocytes Relative: 6 %
Neutro Abs: 3.9 10*3/uL (ref 1.7–7.7)
Neutrophils Relative %: 64 %
Platelet Count: 215 10*3/uL (ref 150–400)
RBC: 5.23 MIL/uL (ref 4.22–5.81)
RDW: 12.5 % (ref 11.5–15.5)
WBC Count: 6 10*3/uL (ref 4.0–10.5)
nRBC: 0 % (ref 0.0–0.2)

## 2021-06-11 LAB — TSH: TSH: 109.5 u[IU]/mL — ABNORMAL HIGH (ref 0.350–4.500)

## 2021-06-11 MED ORDER — SODIUM CHLORIDE 0.9 % IV SOLN: Freq: Once | INTRAVENOUS | Status: AC

## 2021-06-11 MED ORDER — SODIUM CHLORIDE 0.9 % IV SOLN
400.0000 mg | Freq: Once | INTRAVENOUS | Status: AC
  Administered 2021-06-11: 400 mg via INTRAVENOUS
  Filled 2021-06-11: qty 16

## 2021-06-11 NOTE — Patient Instructions (Signed)
[facility]  Discharge Instructions: ?Thank you for choosing [facility] to provide your oncology and hematology care.  ? ?If you have a lab appointment with the Cancer Center, please go directly to the Cancer Center and check in at the registration area. ?  ?Wear comfortable clothing and clothing appropriate for easy access to any Portacath or PICC line.  ? ?We strive to give you quality time with your provider. You may need to reschedule your appointment if you arrive late (15 or more minutes).  Arriving late affects you and other patients whose appointments are after yours.  Also, if you miss three or more appointments without notifying the office, you may be dismissed from the clinic at the provider?s discretion.    ?  ?For prescription refill requests, have your pharmacy contact our office and allow 72 hours for refills to be completed.   ? ?Today you received the following chemotherapy and/or immunotherapy agents: Keytruda ?  ?To help prevent nausea and vomiting after your treatment, we encourage you to take your nausea medication as directed. ? ?BELOW ARE SYMPTOMS THAT SHOULD BE REPORTED IMMEDIATELY: ?*FEVER GREATER THAN 100.4 F (38 ?C) OR HIGHER ?*CHILLS OR SWEATING ?*NAUSEA AND VOMITING THAT IS NOT CONTROLLED WITH YOUR NAUSEA MEDICATION ?*UNUSUAL SHORTNESS OF BREATH ?*UNUSUAL BRUISING OR BLEEDING ?*URINARY PROBLEMS (pain or burning when urinating, or frequent urination) ?*BOWEL PROBLEMS (unusual diarrhea, constipation, pain near the anus) ?TENDERNESS IN MOUTH AND THROAT WITH OR WITHOUT PRESENCE OF ULCERS (sore throat, sores in mouth, or a toothache) ?UNUSUAL RASH, SWELLING OR PAIN  ?UNUSUAL VAGINAL DISCHARGE OR ITCHING  ? ?Items with * indicate a potential emergency and should be followed up as soon as possible or go to the Emergency Department if any problems should occur. ? ?Please show the CHEMOTHERAPY ALERT CARD or IMMUNOTHERAPY ALERT CARD at check-in to the  Emergency Department and triage nurse. ? ?Should you have questions after your visit or need to cancel or reschedule your appointment, please contact [facility]  Dept: [phone]  and follow the prompts.  Office hours are 8:00 a.m. to 4:30 p.m. Monday - Friday. Please note that voicemails left after 4:00 p.m. may not be returned until the following business day.  We are closed weekends and major holidays. You have access to a nurse at all times for urgent questions. Please call the main number to the clinic Dept: [phone] and follow the prompts. ? ? ?For any non-urgent questions, you may also contact your provider using MyChart. We now offer e-Visits for anyone 18 and older to request care online for non-urgent symptoms. For details visit [email or web address]. ?  ?Also download the MyChart app! Go to the app store, search "MyChart", open the app, select [facility], and log in with your MyChart username and password. ? ?Due to Covid, a mask is required upon entering the hospital/clinic. If you do not have a mask, one will be given to you upon arrival. For doctor visits, patients may have 1 support Mylo Choi aged 18 or older with them. For treatment visits, patients cannot have anyone with them due to current Covid guidelines and our immunocompromised population.  ? ?

## 2021-06-11 NOTE — Progress Notes (Signed)
Hematology and Oncology Follow Up Visit ? ?Jared Burton ?[id number] ?22-Dec-1965 56 y.o. ?06/11/2021 12:39 PM ?[name], MDBadger, [name], MD  ? ?Principle Diagnosis: 56 year old man with T2N1 melanoma of the left lower extremity diagnosed in May 2022.   ? ? ?Prior Therapy: He underwent a wide excision of the left thigh melanoma as well as a left inguinal lymph node dissection on July 26, 2020.  The final pathology showed no residual melanoma with 1 out of 10 lymph nodes showed metastatic disease.   ? ?Current therapy: Pembrolizumab 400 mg every 6 weeks started on September 18, 2020.  He is here for cycle 7 of therapy. ? ?Interim History: Jared Burton is here for follow-up visit.  Since last visit, he reports no major changes in his health.  He has reported right thigh discomfort which has been periodically bothering him for the last 3 months.  He has been taking ibuprofen for it.  He has been playing more tennis which could have affected his symptoms.  He denies any complications related to Pembrolizumab.  He denies any nausea, vomiting or abdominal pain.  He denies any skin rashes or lesions.  Continues to take thyroid medication without any changes in his energy or exercise tolerance. ? ? ?Medications: Updated on review. ?Current Outpatient Medications  ?Medication Sig Dispense Refill  ? acetaminophen (TYLENOL) 500 MG tablet Take by mouth.    ? ibuprofen (ADVIL) 200 MG tablet Take by mouth.    ? IBUPROFEN PO Take by mouth. As needed    ? levocetirizine (XYZAL) 5 MG tablet Take 5 mg by mouth every evening.    ? levothyroxine (SYNTHROID) 50 MCG tablet TAKE 1 TABLET(50 MCG) BY MOUTH DAILY BEFORE AND BREAKFAST 90 tablet 3  ? prochlorperazine (COMPAZINE) 10 MG tablet Take 1 tablet (10 mg total) by mouth every 6 (six) hours as needed for nausea or vomiting. 30 tablet 3  ? ?No current facility-administered medications for this visit.  ? ? ? ?Allergies: No Known Allergies ? ? ? ?Physical Exam: ? ? ? ?Blood pressure (!)  170/98, pulse 69, temperature 98.1 ?F (36.7 ?C), temperature source Temporal, resp. rate 17, height 6\' 2"  (1.88 m), weight 227 lb 11.2 oz (103.3 kg), SpO2 99 %. ? ? ?ECOG: 0 ? ? ? ?General appearance: Alert, awake without any distress. ?Head: Atraumatic without abnormalities ?Oropharynx: Without any thrush or ulcers. ?Eyes: No scleral icterus. ?Lymph nodes: No lymphadenopathy noted in the cervical, supraclavicular, or axillary nodes ?Heart:regular rate and rhythm, without any murmurs or gallops.   ?Lung: Clear to auscultation without any rhonchi, wheezes or dullness to percussion. ?Abdomin: Soft, nontender without any shifting dullness or ascites. ?Musculoskeletal: No clubbing or cyanosis. ?Neurological: No motor or sensory deficits. ?Skin: No rashes or lesions. ? ? ? ? ? ? ? ? ? ? ? ?Lab Results: ?Lab Results  ?Component Value Date  ? WBC 9.5 04/23/2021  ? HGB 15.5 04/23/2021  ? HCT 45.2 04/23/2021  ? MCV 89.2 04/23/2021  ? PLT 210 04/23/2021  ? ?  Chemistry   ?   ?Component Value Date/Time  ? NA 138 04/23/2021 1116  ? K 3.8 04/23/2021 1116  ? CL 104 04/23/2021 1116  ? CO2 26 04/23/2021 1116  ? BUN 19 04/23/2021 1116  ? CREATININE 1.32 (H) 04/23/2021 1116  ?    ?Component Value Date/Time  ? CALCIUM 9.2 04/23/2021 1116  ? ALKPHOS 81 04/23/2021 1116  ? AST 19 04/23/2021 1116  ? ALT 13 04/23/2021 1116  ?  BILITOT 0.8 04/23/2021 1116  ?  ? ? ? ?Impression and Plan: ? ?56 year old with: ? ?1.  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  He presented with T2aN1 disease. ?  ?The natural course of this disease was reviewed at this time and treatment choices were reiterated.  I have recommended proceeding with treatment today and the next cycle of will be in June which will conclude treatment at that time.  Repeat imaging studies will be done after that around July 2023 for staging purposes.  He is agreeable to proceed. ?  ?  ?2.  IV access: No issues reported with peripheral veins at this time.  . ?  ?   ?3.  Immune mediated complications: I continue to educate him about complication clued pneumonitis, colitis and thyroid disease. ?  ?  ?4.  Antiemetics: Compazine is available to him without any nausea or vomiting. ?  ?5.  Dermatology surveillance: He is at risk of developing other skin cancer and I recommended continued active surveillance with dermatology. ? ?6.  Hypothyroidism: He continues to be on thyroid replacement with improvement in his TSH. ? ?7.  Pulmonary nodules: Unclear etiology and will update his staging scans and July 2023. ?  ?  ?8.  Follow-up: He will return in 6 weeks for follow-up evaluation. ?  ?30  minutes were spent on this encounter.  The time was dedicated to reviewing laboratory data, disease status update and addressing complications noted to cancer and cancer therapy. ?  ? ? ? ?Zola Button, MD ?4/18/202312:39 PM ? ? ? ? ?

## 2021-06-12 ENCOUNTER — Telehealth: Payer: Self-pay | Admitting: *Deleted

## 2021-06-12 MED ORDER — LEVOTHYROXINE SODIUM 50 MCG PO TABS
100.0000 ug | ORAL_TABLET | Freq: Every day | ORAL | 3 refills | Status: DC
Start: 2021-06-12 — End: 2021-12-16

## 2021-06-12 NOTE — Addendum Note (Signed)
Addended by: Wyatt Portela on: 06/12/2021 08:30 AM ? ? Modules accepted: Orders ? ?

## 2021-06-12 NOTE — Telephone Encounter (Signed)
-----   Message from Wyatt Portela, MD sent at 06/12/2021  8:51 AM EDT ----- ?Please let him know to double his synthroid to a total of 100 mcg. New Rx sent to his pharamcy ?

## 2021-06-12 NOTE — Telephone Encounter (Signed)
PC to patient, no answer, left VM - instructed patient to increase his synthroid dose to 100 mcg daily - he is currently taking 50 mcg, instructed him to take two tablets daily (100 mcg).  Dr. Alen Blew has sent in new rx to his pharmacy. Instructed patient to call this office with any questions/concerns, [phone]. ?

## 2021-06-26 ENCOUNTER — Telehealth: Payer: Self-pay | Admitting: Oncology

## 2021-06-26 NOTE — Telephone Encounter (Signed)
Called patient regarding upcoming May appointment, left a voicemail. ?

## 2021-07-01 ENCOUNTER — Telehealth: Payer: Self-pay | Admitting: *Deleted

## 2021-07-01 NOTE — Telephone Encounter (Signed)
-----   Message from Wyatt Portela, MD sent at 07/01/2021  7:35 AM EDT ----- ?He needs to see Orthopedics at [facility] for possible surgery. Is that who Raliegh Ip wanted him to see? He needs to see Dr. Magda Bernheim at [facility] [phone]). Please make the referral for him and let the patient know thats who he needs to see first.  ?Thanks ?----- Message ----- ?From: Rolene Course, RN ?Sent: 06/28/2021   5:12 PM EDT ?To: Patton Salles, RN, Wyatt Portela, MD ? ?This patient called late Friday afternoon, he recently had a MRI at [facility] which shows a lesion in his R femur.  Raliegh Ip says the report is in [place], I also had them fax a paper copy of the report which is on your desk.  Raliegh Ip was going to send a referral somewhere in [place] but the patient said he would prefer to see you. ? ?Thanks, ?Bethena Roys ? ? ?

## 2021-07-01 NOTE — Telephone Encounter (Signed)
Dr Leonides Schanz with [facility] called to speak with Dr Alen Blew about Mr Rudman. States Mr Minton is in the office now. Ph: [phone]. ? ? ?FYI: ?RN has already arranged an appt with Dr Magda Bernheim at [facility] on 07/23/21.  ?

## 2021-07-01 NOTE — Telephone Encounter (Signed)
Referral called to [facility], Dr Magda Bernheim. ? ?Appt: 07/23/21 0930 ?Dr Magda Bernheim ?[facility] ?Ph:[phone] ? ?LM for [name] with appt time. [facility] will mail him a [employer]. Bring ID, insurance card, med list and copy of MRI.  ?

## 2021-07-01 NOTE — Telephone Encounter (Signed)
LM for [name] that we are going to send a referral to [facility] to Dr Magda Bernheim ?

## 2021-07-02 ENCOUNTER — Telehealth: Payer: Self-pay | Admitting: *Deleted

## 2021-07-02 NOTE — Telephone Encounter (Signed)
Appointment with Dr. Magda Bernheim at [facility] on 07/23/21 canceled per Dr. Hazeline Junker request. ?

## 2021-07-02 NOTE — Telephone Encounter (Signed)
-----   Message from Wyatt Portela, MD sent at 07/02/2021  2:35 PM EDT ----- ?Regarding: Appointment with Dr. Redmond Pulling. ?Tammi arranged for him to see Dr. Redmond Pulling at [facility] on May 30.  This appointment can be canceled.  He saw Dr. Leonides Schanz at [facility] which is sufficient.  Thanks ? ?

## 2021-07-03 ENCOUNTER — Other Ambulatory Visit: Payer: Self-pay | Admitting: Oncology

## 2021-07-03 ENCOUNTER — Telehealth: Payer: Self-pay | Admitting: *Deleted

## 2021-07-03 MED ORDER — TRAMADOL HCL 50 MG PO TABS: 50.0000 mg | ORAL_TABLET | Freq: Four times a day (QID) | ORAL | 1 refills | Status: AC | PRN

## 2021-07-03 NOTE — Telephone Encounter (Signed)
-----   Message from Wyatt Portela, MD sent at 07/03/2021  9:03 AM EDT ----- ?Will arrange follow up after the scan. I will send Rx for pain medication ?----- Message ----- ?From: Rolene Course, RN ?Sent: 07/03/2021   8:50 AM EDT ?To: Wyatt Portela, MD ? ?This patient's wife called, he has an appointment here on 5/31, she is very concerned about the recent finding of R femur involvement, and is asking if he needs to be seen by you sooner.  He has a PET scan with [facility] on Monday, 5/15.  She also said he is in extreme pain & is asking if can be prescribed a pain medication.  Please advise. ? ?Thanks, ?Bethena Roys ? ? ?

## 2021-07-03 NOTE — Telephone Encounter (Signed)
PC to patient's wife, Kurtis Bushman, informed her scheduling will call to set up an appointment after the patient's PET scan on 07/08/21.  Also informed her a prescription for tramadol has been sent to the patient's pharmacy.  She verbalizes understanding. ?

## 2021-07-05 ENCOUNTER — Telehealth: Payer: Self-pay | Admitting: *Deleted

## 2021-07-05 ENCOUNTER — Other Ambulatory Visit: Payer: Self-pay | Admitting: Oncology

## 2021-07-05 MED ORDER — OXYCODONE HCL 5 MG PO TABS: 5.0000 mg | ORAL_TABLET | ORAL | 0 refills | Status: DC | PRN

## 2021-07-05 NOTE — Telephone Encounter (Signed)
Notified that prescription has been sent to [facility] ?

## 2021-07-05 NOTE — Telephone Encounter (Signed)
Wife states Jared Burton is having extreme pain in his femur when tumor is located. Has been taking tramadol and extra strength tylenol with no relief. BP is elevate with the pain 190/120. ?Is requesting something stronger for pain ?

## 2021-07-06 ENCOUNTER — Other Ambulatory Visit: Payer: Self-pay

## 2021-07-06 ENCOUNTER — Emergency Department (HOSPITAL_COMMUNITY): Payer: Federal, State, Local not specified - PPO

## 2021-07-06 ENCOUNTER — Encounter (HOSPITAL_COMMUNITY): Payer: Self-pay

## 2021-07-06 ENCOUNTER — Emergency Department (HOSPITAL_COMMUNITY)
Admission: EM | Admit: 2021-07-06 | Discharge: 2021-07-06 | Disposition: A | Discharge status: Home / Self Care | Payer: Federal, State, Local not specified - PPO | Attending: Emergency Medicine | Admitting: Emergency Medicine

## 2021-07-06 DIAGNOSIS — D72829 Elevated white blood cell count, unspecified: Secondary | ICD-10-CM | POA: Diagnosis not present

## 2021-07-06 DIAGNOSIS — R7989 Other specified abnormal findings of blood chemistry: Secondary | ICD-10-CM | POA: Insufficient documentation

## 2021-07-06 DIAGNOSIS — Z79899 Other long term (current) drug therapy: Secondary | ICD-10-CM | POA: Diagnosis not present

## 2021-07-06 DIAGNOSIS — M898X9 Other specified disorders of bone, unspecified site: Secondary | ICD-10-CM | POA: Insufficient documentation

## 2021-07-06 DIAGNOSIS — Z85828 Personal history of other malignant neoplasm of skin: Secondary | ICD-10-CM | POA: Insufficient documentation

## 2021-07-06 DIAGNOSIS — E039 Hypothyroidism, unspecified: Secondary | ICD-10-CM | POA: Insufficient documentation

## 2021-07-06 DIAGNOSIS — M79651 Pain in right thigh: Secondary | ICD-10-CM | POA: Diagnosis present

## 2021-07-06 LAB — CBC WITH DIFFERENTIAL/PLATELET
Abs Immature Granulocytes: 0.05 10*3/uL (ref 0.00–0.07)
Basophils Absolute: 0 10*3/uL (ref 0.0–0.1)
Basophils Relative: 0 %
Eosinophils Absolute: 0 10*3/uL (ref 0.0–0.5)
Eosinophils Relative: 0 %
HCT: 45.2 % (ref 39.0–52.0)
Hemoglobin: 15.8 g/dL (ref 13.0–17.0)
Immature Granulocytes: 0 %
Lymphocytes Relative: 11 %
Lymphs Abs: 1.5 10*3/uL (ref 0.7–4.0)
MCH: 30.3 pg (ref 26.0–34.0)
MCHC: 35 g/dL (ref 30.0–36.0)
MCV: 86.8 fL (ref 80.0–100.0)
Monocytes Absolute: 1 10*3/uL (ref 0.1–1.0)
Monocytes Relative: 7 %
Neutro Abs: 10.9 10*3/uL — ABNORMAL HIGH (ref 1.7–7.7)
Neutrophils Relative %: 82 %
Platelets: 294 10*3/uL (ref 150–400)
RBC: 5.21 MIL/uL (ref 4.22–5.81)
RDW: 12.3 % (ref 11.5–15.5)
WBC: 13.4 10*3/uL — ABNORMAL HIGH (ref 4.0–10.5)
nRBC: 0 % (ref 0.0–0.2)

## 2021-07-06 LAB — COMPREHENSIVE METABOLIC PANEL
ALT: 20 U/L (ref 0–44)
AST: 18 U/L (ref 15–41)
Albumin: 4.4 g/dL (ref 3.5–5.0)
Alkaline Phosphatase: 63 U/L (ref 38–126)
Anion gap: 10 (ref 5–15)
BUN: 23 mg/dL — ABNORMAL HIGH (ref 6–20)
CO2: 25 mmol/L (ref 22–32)
Calcium: 9.1 mg/dL (ref 8.9–10.3)
Chloride: 97 mmol/L — ABNORMAL LOW (ref 98–111)
Creatinine, Ser: 1.26 mg/dL — ABNORMAL HIGH (ref 0.61–1.24)
GFR, Estimated: >60 mL/min (ref >60)
Glucose, Bld: 125 mg/dL — ABNORMAL HIGH (ref 70–99)
Potassium: 3.4 mmol/L — ABNORMAL LOW (ref 3.5–5.1)
Sodium: 132 mmol/L — ABNORMAL LOW (ref 135–145)
Total Bilirubin: 1.2 mg/dL (ref 0.3–1.2)
Total Protein: 7.3 g/dL (ref 6.5–8.1)

## 2021-07-06 LAB — ACETAMINOPHEN LEVEL: Acetaminophen (Tylenol), Serum: 34 ug/mL — ABNORMAL HIGH (ref 10–30)

## 2021-07-06 MED ORDER — FENTANYL 25 MCG/HR TD PT72: 1.0000 | MEDICATED_PATCH | TRANSDERMAL | 0 refills | Status: DC

## 2021-07-06 MED ORDER — HYDROMORPHONE HCL 2 MG PO TABS
2.0000 mg | ORAL_TABLET | Freq: Once | ORAL | Status: AC
  Administered 2021-07-06: 2 mg via ORAL
  Filled 2021-07-06: qty 1

## 2021-07-06 MED ORDER — FENTANYL 25 MCG/HR TD PT72
1.0000 | MEDICATED_PATCH | TRANSDERMAL | Status: DC
  Administered 2021-07-06: 1 via TRANSDERMAL
  Filled 2021-07-06: qty 1

## 2021-07-06 NOTE — ED Provider Notes (Signed)
?[facility] DEPT ?Provider Note ? ? ?CSN: [id number] ?Arrival date & time: 07/06/21  1902 ? ?  ? ?History ? ?Chief Complaint  ?Patient presents with  ? Pain  ? ? ?Jared Burton is a 56 y.o. male. ? ?Pt is a 56 yo male with a pmhx significant for hypothyroidism, multiple basal skin cell cancers and metastatic melanoma.  Pt had a left thigh melanoma excision with left inguinal LN dissection in June of 2022.  Pt had been on Pembrolizumab 400 mg every 6 weeks starting on September 18, 2020.  He noticed that he has been having more right thigh pain in April.  He went to his orthopedist and a MRI was done.  MRI showed a metastatic lesion in his right femur.  Pt saw another orthopedist who recommended a PET scan to check for other possible metastatic sites prior to any operation.  PET scan is scheduled.  Pt said he's had worsening of pain.  At first, he took tramadol.  He then switched to oxycodone 5 mg.  He said that it wears off after about 3 hrs and he feels horrible pain.  His wife said he was shaking in pain earlier today.  He denies any other spots of pain other than his back which always hurts. ? ? ?  ? ?Home Medications ?Prior to Admission medications   ?Medication Sig Start Date End Date Taking? Authorizing Provider  ?fentaNYL (DURAGESIC) 25 MCG/HR Place 1 patch onto the skin every 3 (three) days. 07/06/21  Yes Isla Pence, MD  ?acetaminophen (TYLENOL) 500 MG tablet Take by mouth.    [provider]  ?ibuprofen (ADVIL) 200 MG tablet Take by mouth.    [provider]  ?IBUPROFEN PO Take by mouth. As needed    [provider]  ?levocetirizine (XYZAL) 5 MG tablet Take 5 mg by mouth every evening.    [provider]  ?levothyroxine (SYNTHROID) 50 MCG tablet Take 2 tablets (100 mcg total) by mouth daily before breakfast. 06/12/21   Wyatt Portela, MD  ?oxyCODONE (OXY IR/ROXICODONE) 5 MG immediate release tablet Take 1 tablet (5 mg total) by mouth every 4  (four) hours as needed for severe pain. 07/05/21   Wyatt Portela, MD  ?prochlorperazine (COMPAZINE) 10 MG tablet Take 1 tablet (10 mg total) by mouth every 6 (six) hours as needed for nausea or vomiting. 03/12/21   Wyatt Portela, MD  ?traMADol (ULTRAM) 50 MG tablet Take 1 tablet (50 mg total) by mouth every 6 (six) hours as needed. 07/03/21   Wyatt Portela, MD  ?   ? ?Allergies    ?Patient has no known allergies.   ? ?Review of Systems   ?Review of Systems  ?Musculoskeletal:   ?     Right femur pain  ?All other systems reviewed and are negative. ? ?Physical Exam ?Updated Vital Signs ?BP (!) 159/120   Pulse 74   Temp 98 ?F (36.7 ?C) (Oral)   Resp 16   Ht 6' 2" (1.88 m)   Wt 99.8 kg   SpO2 93%   BMI 28.25 kg/m?  ?Physical Exam ?Vitals and nursing note reviewed.  ?Constitutional:   ?   Appearance: Normal appearance.  ?HENT:  ?   Head: Normocephalic and atraumatic.  ?   Right Ear: External ear normal.  ?   Left Ear: External ear normal.  ?   Nose: Nose normal.  ?   Mouth/Throat:  ?   Mouth: Mucous membranes  are moist.  ?   Pharynx: Oropharynx is clear.  ?Eyes:  ?   Extraocular Movements: Extraocular movements intact.  ?   Conjunctiva/sclera: Conjunctivae normal.  ?   Pupils: Pupils are equal, round, and reactive to light.  ?Cardiovascular:  ?   Rate and Rhythm: Normal rate and regular rhythm.  ?   Pulses: Normal pulses.  ?   Heart sounds: Normal heart sounds.  ?Pulmonary:  ?   Effort: Pulmonary effort is normal.  ?   Breath sounds: Normal breath sounds.  ?Abdominal:  ?   General: Abdomen is flat. Bowel sounds are normal.  ?   Palpations: Abdomen is soft.  ?Musculoskeletal:  ?   Cervical back: Normal range of motion and neck supple.  ?   Comments: Pain in right femur.  No masses or lesions felt or seen in skin.  ?Skin: ?   General: Skin is warm.  ?   Capillary Refill: Capillary refill takes less than 2 seconds.  ?Neurological:  ?   General: No focal deficit present.  ?   Mental Status: He is alert and oriented  to person, place, and time.  ?Psychiatric:     ?   Mood and Affect: Mood normal.     ?   Behavior: Behavior normal.  ? ? ?ED Results / Procedures / Treatments   ?Labs ?(all labs ordered are listed, but only abnormal results are displayed) ?Labs Reviewed  ?ACETAMINOPHEN LEVEL - Abnormal; Notable for the following components:  ?    Result Value  ? Acetaminophen (Tylenol), Serum 34 (*)   ? All other components within normal limits  ?COMPREHENSIVE METABOLIC PANEL - Abnormal; Notable for the following components:  ? Sodium 132 (*)   ? Potassium 3.4 (*)   ? Chloride 97 (*)   ? Glucose, Bld 125 (*)   ? BUN 23 (*)   ? Creatinine, Ser 1.26 (*)   ? All other components within normal limits  ?CBC WITH DIFFERENTIAL/PLATELET - Abnormal; Notable for the following components:  ? WBC 13.4 (*)   ? Neutro Abs 10.9 (*)   ? All other components within normal limits  ? ? ?EKG ?None ? ?Radiology ?DG Femur Min 2 Views Right ? ?Result Date: 07/06/2021 ?CLINICAL DATA:  pain.  History of melanoma. EXAM: RIGHT FEMUR 2 VIEWS COMPARISON:  Jun 27, 2021 MRI FINDINGS: There is an infiltrative lytic lesion of the proximal to mid femoral shaft. No acute fractures noted. Lytic lesion erodes partially into the lateral cortex. Lesion appears to involve the majority of the marrow cavity. Soft tissues are unremarkable. IMPRESSION: Revisualization of osseous metastatic disease within the proximal to mid femoral shaft. No evidence of acute pathologic fracture at this point in time. Electronically Signed   By: Valentino Saxon M.D.   On: 07/06/2021 20:31   ? ?Procedures ?Procedures  ? ? ?Medications Ordered in ED ?Medications  ?fentaNYL (DURAGESIC) 25 MCG/HR 1 patch (has no administration in time range)  ?HYDROmorphone (DILAUDID) tablet 2 mg (2 mg Oral Given 07/06/21 1951)  ? ? ?ED Course/ Medical Decision Making/ A&P ?  ?                        ?Medical Decision Making ?Amount and/or Complexity of Data Reviewed ?Radiology: ordered. ? ? ?This patient  presents to the ED for concern of right leg pain, this involves an extensive number of treatment options, and is a complaint that carries with it a high risk  of complications and morbidity.  The differential diagnosis includes bony met, pathologic fx ? ? ?Co morbidities that complicate the patient evaluation ? ?hypothyroidism, multiple basal skin cell cancers and metastatic melanoma ? ? ?Additional history obtained: ? ?Additional history obtained from epic chart review ?External records from outside source obtained and reviewed including wife ? ? ?Lab Tests: ? ?I Ordered, and personally interpreted labs.  The pertinent results include:  cbc with wbc elevated at 13.4; cmp with Cr 1.26; tylenol level 34 ? ? ?Imaging Studies ordered: ? ?I ordered imaging studies including right femur  ?I independently visualized and interpreted imaging which showed  ?  ?IMPRESSION:  ?Revisualization of osseous metastatic disease within the proximal to  ?mid femoral shaft. No evidence of acute pathologic fracture at this  ?point in time.  ? ?I agree with the radiologist interpretation ? ? ?Cardiac Monitoring: ? ?The patient was maintained on a cardiac monitor.  I personally viewed and interpreted the cardiac monitored which showed an underlying rhythm of: nsr ? ? ?Medicines ordered and prescription drug management: ? ?I ordered medication including dilaudid  for pain  ?Reevaluation of the patient after these medicines showed that the patient improved ?I have reviewed the patients home medicines and have made adjustments as needed ? ?Critical Interventions: ? ?Pain control ? ? ?Problem List / ED Course: ? ?Bony met pain:  no evidence of pathologic fx.  He would rather try to go home as opposed to admission for pain control.  Pt will be started on fentanyl patches to try to have a more consistent level of pain control.  Pt has appts with all his doctors in the coming days. ? ? ?Reevaluation: ? ?After the interventions noted above, I  reevaluated the patient and found that they have :improved ? ? ?Social Determinants of Health: ? ?Lives at home with his wife ? ? ?Dispostion: ? ?After consideration of the diagnostic results and the patients response to

## 2021-07-06 NOTE — ED Triage Notes (Signed)
Pt has pain to his right femur from a tumor that was found. Pt's current pain management is not helping and pt is experiencing more frequent episodes of uncontrollable pain.  ?

## 2021-07-06 NOTE — ED Provider Triage Note (Signed)
Emergency Medicine Provider Triage Evaluation Note ? ?Jared Burton , a 56 y.o. male  was evaluated in triage.  Pt complains of right femur pain.  Patient was recently diagnosed with a 6 inch tumor in the right femur.  Patient has history of melanoma that was stage III on the left leg.  He is currently seeing a orthopedic tumor specialist as well as Dr. Alen Blew for oncology.  Patient states that his pain increased and that his regimen of Tylenol and instant release oxycodone is not helping pain.  Patient states that he called oncology who recommended he come to the emergency department for evaluation of pain management.  Patient did endorse taking a larger amount of Tylenol than prescribed. ? ?Review of Systems  ?Positive: Right leg pain ?Negative: Shortness of breath, fever ? ?Physical Exam  ?BP (!) 177/122 (BP Location: Left Arm)   Pulse 82   Temp 98 ?F (36.7 ?C) (Oral)   Resp 16   Ht 6\' 2"  (1.88 m)   Wt 99.8 kg   SpO2 98%   BMI 28.25 kg/m?  ?Gen:   Awake, no distress   ?Resp:  Normal effort  ?MSK:   Patient utilizing crutches for ambulation ?Other:   ? ?Medical Decision Making  ?Medically screening exam initiated at 7:24 PM.  Appropriate orders placed.  Jared Burton was informed that the remainder of the evaluation will be completed by another provider, this initial triage assessment does not replace that evaluation, and the importance of remaining in the ED until their evaluation is complete. ? ? ?  ?Dorothyann Peng, PA-C ?07/06/21 1926 ? ?

## 2021-07-08 ENCOUNTER — Telehealth: Payer: Self-pay | Admitting: Oncology

## 2021-07-08 ENCOUNTER — Telehealth: Payer: Self-pay

## 2021-07-08 ENCOUNTER — Other Ambulatory Visit: Payer: Self-pay | Admitting: Oncology

## 2021-07-08 MED ORDER — OXYCODONE HCL 5 MG PO TABS: 5.0000 mg | ORAL_TABLET | ORAL | 0 refills | Status: AC | PRN

## 2021-07-08 NOTE — Telephone Encounter (Signed)
Pt's wife, Kurtis Bushman, advised of 5/16 11:45 appt and refill request has been sent to the pharmacy.   ?

## 2021-07-08 NOTE — Telephone Encounter (Signed)
Scheduled per 5/15 in basket, message has been left with pt ?

## 2021-07-08 NOTE — Telephone Encounter (Signed)
T/C from pt's wife, Kurtis Bushman, stating she had to take pt to the ED on Saturday due to severe pain.  They gave him a fentanyl patch for pain and he is taking oxycodone 5 mg every 3 hours and this is not controlling his pain.  Pain scale is still at a 9.  ? ?His next appt is not scheduled until 5/22 and she wants to see if he can come in sooner. ? ?He also needs a refill on the oxycodone 5 mg. ? ?Please advise ?

## 2021-07-09 ENCOUNTER — Other Ambulatory Visit: Payer: Self-pay | Admitting: Oncology

## 2021-07-09 ENCOUNTER — Inpatient Hospital Stay: Payer: Federal, State, Local not specified - PPO | Attending: Oncology | Admitting: Oncology

## 2021-07-09 ENCOUNTER — Ambulatory Visit
Admission: RE | Admit: 2021-07-09 | Discharge: 2021-07-09 | Disposition: A | Discharge status: Home / Self Care | Payer: Federal, State, Local not specified - PPO | Source: Ambulatory Visit | Attending: Radiation Oncology | Admitting: Radiation Oncology

## 2021-07-09 ENCOUNTER — Encounter: Payer: Self-pay | Admitting: Radiation Oncology

## 2021-07-09 ENCOUNTER — Other Ambulatory Visit: Payer: Self-pay

## 2021-07-09 VITALS — BP 176/101 | HR 73 | Temp 98.2°F | Resp 17 | Ht 74.0 in | Wt 218.0 lb

## 2021-07-09 DIAGNOSIS — M79651 Pain in right thigh: Secondary | ICD-10-CM | POA: Diagnosis not present

## 2021-07-09 DIAGNOSIS — C3491 Malignant neoplasm of unspecified part of right bronchus or lung: Secondary | ICD-10-CM

## 2021-07-09 DIAGNOSIS — R918 Other nonspecific abnormal finding of lung field: Secondary | ICD-10-CM | POA: Diagnosis not present

## 2021-07-09 DIAGNOSIS — C4021 Malignant neoplasm of long bones of right lower limb: Secondary | ICD-10-CM

## 2021-07-09 DIAGNOSIS — C439 Malignant melanoma of skin, unspecified: Secondary | ICD-10-CM

## 2021-07-09 DIAGNOSIS — C4372 Malignant melanoma of left lower limb, including hip: Secondary | ICD-10-CM | POA: Diagnosis not present

## 2021-07-09 DIAGNOSIS — M79652 Pain in left thigh: Secondary | ICD-10-CM | POA: Insufficient documentation

## 2021-07-09 DIAGNOSIS — C774 Secondary and unspecified malignant neoplasm of inguinal and lower limb lymph nodes: Secondary | ICD-10-CM | POA: Insufficient documentation

## 2021-07-09 DIAGNOSIS — E039 Hypothyroidism, unspecified: Secondary | ICD-10-CM | POA: Diagnosis not present

## 2021-07-09 MED ORDER — FENTANYL 25 MCG/HR TD PT72: 1.0000 | MEDICATED_PATCH | TRANSDERMAL | 0 refills | Status: AC

## 2021-07-09 NOTE — Progress Notes (Signed)
Hematology and Oncology Follow Up Visit ? ?Jared Burton ?[id number] ?December 12, 1965 56 y.o. ?07/09/2021 11:23 AM ?Jared Burton, MDBadger, Jared Alert, MD  ? ?Principle Diagnosis: 56 year old man with melanoma of the left lower extremity diagnosed in May 2022.  He was found to have T2N1 disease.  He developed mid femoral shaft metastatic lesion in May 2023. ? ? ?Prior Therapy: He underwent a wide excision of the left thigh melanoma as well as a left inguinal lymph node dissection on July 26, 2020.  The final pathology showed no residual melanoma with 1 out of 10 lymph nodes showed metastatic disease.   ? ?Current therapy:  ? ?Pembrolizumab 400 mg every 6 weeks started on September 18, 2020.  He completed 7 cycles of therapy. ? ?Under consideration for therapy to the left femur. ? ?Interim History: Mr. Jared Burton returns today for repeat evaluation.  Since last visit, he discovered to have pain in his left thigh continues to worsen and was evaluated by orthopedics and x-ray of the right femur showed a suspicious lesion.  MRI of the thigh showed an aggressive appearing marrow replacing lesion in the right proximal to mid femoral diaphysis measuring 15.2 cm without soft tissue extension.  Based on these findings he was evaluated by Dr. Leonides Burton and currently and evaluation for possible surgical intervention versus radiation therapy.  In the meantime, continues to have increased pain and was seen in the emergency department on Jul 06, 2021.  He completed a PET scan on Jul 08, 2021 which showed a hypermetabolic right femoral lesion as well as a new hypermetabolic right upper lobe nodule. ? ?Clinically, he reports his pain has improved with fentanyl patch and uses oxycodone for breakthrough.  He denies any falls or syncope.  He is limiting his weightbearing utilizing crutches.  He denies any shortness of breath or difficulty breathing.  He denies any hospitalizations or illnesses. ? ? ?Medications: Updated on review. ?Current Outpatient  Medications  ?Medication Sig Dispense Refill  ? acetaminophen (TYLENOL) 500 MG tablet Take by mouth.    ? fentaNYL (DURAGESIC) 25 MCG/HR Place 1 patch onto the skin every 3 (three) days. 5 patch 0  ? ibuprofen (ADVIL) 200 MG tablet Take by mouth.    ? IBUPROFEN PO Take by mouth. As needed    ? levocetirizine (XYZAL) 5 MG tablet Take 5 mg by mouth every evening.    ? levothyroxine (SYNTHROID) 50 MCG tablet Take 2 tablets (100 mcg total) by mouth daily before breakfast. 90 tablet 3  ? oxyCODONE (OXY IR/ROXICODONE) 5 MG immediate release tablet Take 1 tablet (5 mg total) by mouth every 4 (four) hours as needed for severe pain. 30 tablet 0  ? prochlorperazine (COMPAZINE) 10 MG tablet Take 1 tablet (10 mg total) by mouth every 6 (six) hours as needed for nausea or vomiting. 30 tablet 3  ? traMADol (ULTRAM) 50 MG tablet Take 1 tablet (50 mg total) by mouth every 6 (six) hours as needed. 30 tablet 1  ? ?No current facility-administered medications for this visit.  ? ? ? ?Allergies: No Known Allergies ? ? ? ?Physical Exam: ? ? ?Blood pressure (!) 176/101, pulse 73, temperature 98.2 ?F (36.8 ?C), temperature source Temporal, resp. rate 17, height 6' 2"  (1.88 m), weight 218 lb (98.9 kg), SpO2 97 %. ? ? ? ? ?ECOG: 0 ? ? ?General appearance: Comfortable appearing without any discomfort ?Head: Normocephalic without any trauma ?Oropharynx: Mucous membranes are moist and pink without any thrush or ulcers. ?Eyes: Pupils are  equal and round reactive to light. ?Lymph nodes: No cervical, supraclavicular, inguinal or axillary lymphadenopathy.   ?Heart:regular rate and rhythm.  S1 and S2 without leg edema. ?Lung: Clear without any rhonchi or wheezes.  No dullness to percussion. ?Abdomin: Soft, nontender, nondistended with good bowel sounds.  No hepatosplenomegaly. ?Musculoskeletal: No joint deformity or effusion.  Full range of motion noted. ?Neurological: No deficits noted on motor, sensory and deep tendon reflex exam. ?Skin: No  petechial rash or dryness.  Appeared moist.  ? ? ? ? ? ? ? ? ? ? ? ?Lab Results: ?Lab Results  ?Component Value Date  ? WBC 13.4 (H) 07/06/2021  ? HGB 15.8 07/06/2021  ? HCT 45.2 07/06/2021  ? MCV 86.8 07/06/2021  ? PLT 294 07/06/2021  ? ?  Chemistry   ?   ?Component Value Date/Time  ? NA 132 (L) 07/06/2021 1944  ? K 3.4 (L) 07/06/2021 1944  ? CL 97 (L) 07/06/2021 1944  ? CO2 25 07/06/2021 1944  ? BUN 23 (H) 07/06/2021 1944  ? CREATININE 1.26 (H) 07/06/2021 1944  ? CREATININE 1.37 (H) 06/11/2021 1234  ?    ?Component Value Date/Time  ? CALCIUM 9.1 07/06/2021 1944  ? ALKPHOS 63 07/06/2021 1944  ? AST 18 07/06/2021 1944  ? AST 16 06/11/2021 1234  ? ALT 20 07/06/2021 1944  ? ALT 10 06/11/2021 1234  ? BILITOT 1.2 07/06/2021 1944  ? BILITOT 0.8 06/11/2021 1234  ?  ? ?FINDINGS:  ?Hypermetabolic right femoral lesion max SUV 33.8, new since prior exam  ? ?Hypermetabolic right middle lobe nodule measuring 10 mm max SUV 10.9 (image 143)  ? ?No other worrisome hypermetabolic activity identified.  ? ?Additional findings:  ?Postop change in the left inguinal region consistent with prior nodal resection.  ?Constipation.  ? ? ? ?IMPRESSION:  ?Hypermetabolic right femoral lesion, new since prior exam.  ?New hypermetabolic right upper lobe nodule.   ? ?Impression and Plan: ? ?56 year old with: ? ?1.  T2aN1 melanoma of the left lower extremity diagnosed in June 2022.  He presented with relapsed disease in the right femoral lesion in May 2023. ? ? ?He is currently receiving adjuvant Pembrolizumab which clearly does not provide adequate treatment at this time.  Different salvage therapy option including salvage therapy with ipilimumab and nivolumab versus BRAF targeted therapy if he harbors the appropriate mutation.  Systemic therapy will be switched to in the near future.  ?  ?  ?2.  Right femoral lesion: He is currently under evaluation for radiation therapy and stabilization surgery is deferred at this time. ?  ?  ?3.  Immune  mediated complications: He has not experienced any new complications.  Combination immunotherapy with ipilimumab and nivolumab could bring more complications. ?  ?  ?4.  Antiemetics: No nausea or vomiting reported at this time.  Compazine is available to him. ?  ?5.  Dermatology surveillance: I recommended follow-up with dermatology to continue. ? ? ?6.  Hypothyroidism: We will continue to monitor his TSH and replace as needed. ? ?7.  Pulmonary nodules: This appears to be hypermetabolic in nature in the right upper lobe.  Radiation therapy could also be assessed. ?  ?8.  Prognosis: This was discussed with the patient and his wife in detail.  Clearly he has developed metastatic disease despite being on immunotherapy carries a worse prognosis and likely were dealing with an incurable disease.  However, he does not have widespread metastatic disease that could be still salvageable with combination immunotherapy  versus oral targeted therapy.  His performance status and excellent risk of major organ. ?  ?9.  Follow-up: In 2 weeks for repeat follow-up. ?  ?30  minutes were dedicated to this encounter.  The time was spent on reviewing laboratory data, disease status update, reviewing imaging studies and answering questions regarding treatment and prognosis. ?  ? ? ? ?Zola Button, MD ?5/16/202311:23 AM ? ? ? ? ?

## 2021-07-09 NOTE — Progress Notes (Signed)
?[place]         [phone]) [phone] ?________________________________ ? ?Initial Outpatient Consultation - Conducted via telephone due to current COVID-19 concerns for limiting patient exposure ? ?Name: Jared Burton MRN: [id number]  ?Date: 07/09/2021  DOB: Jan 09, 1966 ? ?REFERRING PHYSICIAN: Wyatt Portela, MD ? 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 ?  ICD-10-CM   ?1. Melanoma of skin ([facility])  C43.9   ?  ?2. Malignant neoplasm of right femur ([facility])  C40.21   ?  ?3. 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 C34.91   ?  ? ? ?HISTORY OF PRESENT ILLNESS::Jared Burton is a 56 y.o. male who underwent a wide excision of the left thigh melanoma as well as a left inguinal lymph node dissection on July 26, 2020.  The final pathology showed no residual melanoma with 1 out of 10 lymph nodes showed metastatic disease. He has subsequently been receiving Pembrolizumab 400 mg every 6 weeks started on September 18, 2020 and he has completed 7 cycles of therapy. ? ?3-4 months ago, he started to develop pain in his right thigh localizing in the quadraceps continues to worsen and was evaluated by orthopedics and x-ray of the right femur showed a suspicious lesion.  MRI of the thigh showed an aggressive appearing marrow replacing lesion in the right proximal to mid femoral diaphysis measuring 15.2 cm without soft tissue extension.  Based on these findings he was evaluated by Dr. Leonides Schanz and currently and evaluation for possible surgical intervention versus radiation therapy.  In the meantime, continues to have increased pain and was seen in the emergency department on Jul 06, 2021.  He completed a PET scan on Jul 08, 2021 which showed a hypermetabolic right femoral lesion as well as a new 1 cm hypermetabolic right middle lobe lung nodule. ?  ?His pain has improved with fentanyl patch and uses oxycodone for breakthrough.  He  is limiting his weightbearing utilizing crutches.  Dr. Leonides Schanz recommended radiotherapy.   ?  ? ?PREVIOUS RADIATION THERAPY: No ? ?Past Medical History:  ?Diagnosis Date  ? Allergy   ? Atypical nevus 09/12/2009  ? Mid Lower - Moderate to Severe  ? Atypical nevus 09/12/2009  ? Left Medial Leg - Severe/Evolution of Melanoma in Situ  ? BCC (basal cell carcinoma of skin) 11/11/2006  ? Left Upper Arm  ? BCC (basal cell carcinoma of skin) 11/11/2006  ? Left Temple  ? BCC (basal cell carcinoma of skin) 11/11/2006  ? Right Front Shoulder  ? BCC (basal cell carcinoma of skin) 11/11/2006  ? Right Arm Crease/Elbow  ? BCC (basal cell carcinoma of skin) 09/12/2009  ? Left Shoulder  ? BCC (basal cell carcinoma of skin) 09/12/2009  ? Left Lower Back  ? BCC (basal cell carcinoma of skin) 12/12/2009  ? Left Top Shoulder  ? BCC (basal cell carcinoma of skin) 05/23/2013  ? Left Posterior Auricular, Right Chest, Right Upper Back, Right Mid Back Lateral, Right Mid Back Medial, Left Upper Arm, Left Calf  ? BCC (basal cell carcinoma of skin) Ulcerated 09/12/2009  ? Right Lateral Leg  ? BCC (basal cell carcinoma of skin) Ulcerated and Infiltrative 09/12/2009  ? Right Hand  ? Infiltrative basal cell carcinoma (BCC) 06/02/2017  ? Left Tip of Nose  ? Nodular basal cell carcinoma (BCC) 06/02/2017  ? Mid Upper Back, Left Nare, Right Upper Arm  Inferior  ? Superficial basal cell carcinoma (BCC) 11/11/2006  ? Left Upper Back  ? Superficial basal cell carcinoma (BCC) 11/11/2006  ? Mid Upper Chest  ? Superficial basal cell carcinoma (BCC) 06/02/2017  ?  Right Upper Arm Superior, Back Left Lower Thigh, Back Right Knee Crease  ? Superficial basal cell carcinoma (BCC) 06/02/2017  ? Left Ear Rim  ? Superficial nodular basal cell carcinoma (BCC) 06/02/2017  ? Right Inner Deltoid  ?: ? ? ?Past Surgical History:  ?Procedure Laterality Date  ? TONSILLECTOMY    ? as a child  ? WRIST FRACTURE SURGERY    ? as a child  ?: ? ? ?Current Outpatient Medications:  ?   acetaminophen (TYLENOL) 500 MG tablet, Take by mouth., Disp: , Rfl:  ?  fentaNYL (DURAGESIC) 25 MCG/HR, Place 1 patch onto the skin every 3 (three) days., Disp: 5 patch, Rfl: 0 ?  ibuprofen (ADVIL) 200 MG tablet, Take by mouth., Disp: , Rfl:  ?  IBUPROFEN PO, Take by mouth. As needed, Disp: , Rfl:  ?  levocetirizine (XYZAL) 5 MG tablet, Take 5 mg by mouth every evening., Disp: , Rfl:  ?  levothyroxine (SYNTHROID) 50 MCG tablet, Take 2 tablets (100 mcg total) by mouth daily before breakfast., Disp: 90 tablet, Rfl: 3 ?  oxyCODONE (OXY IR/ROXICODONE) 5 MG immediate release tablet, Take 1 tablet (5 mg total) by mouth every 4 (four) hours as needed for severe pain., Disp: 30 tablet, Rfl: 0 ?  prochlorperazine (COMPAZINE) 10 MG tablet, Take 1 tablet (10 mg total) by mouth every 6 (six) hours as needed for nausea or vomiting., Disp: 30 tablet, Rfl: 3 ?  traMADol (ULTRAM) 50 MG tablet, Take 1 tablet (50 mg total) by mouth every 6 (six) hours as needed., Disp: 30 tablet, Rfl: 1: ? ?No Known Allergies: ? ? ?Family History  ?Problem Relation Age of Onset  ? Colon cancer Neg Hx   ? Colon polyps Neg Hx   ? Esophageal cancer Neg Hx   ? Stomach cancer Neg Hx   ? Rectal cancer Neg Hx   ?: ? ? ?Social History  ? ?Socioeconomic History  ? Marital status: Married  ?  Spouse name: Not on file  ? Number of children: Not on file  ? Years of education: Not on file  ? Highest education level: Not on file  ?Occupational History  ? Not on file  ?Tobacco Use  ? Smoking status: Never  ? Smokeless tobacco: Never  ?Vaping Use  ? Vaping Use: Never used  ?Substance and Sexual Activity  ? Alcohol use: Yes  ?  Comment: a few drinks / week (beer and wine)  ? Drug use: Never  ? Sexual activity: Not on file  ?Other Topics Concern  ? Not on file  ?Social History Narrative  ? Not on file  ? ?Social Determinants of Health  ? ?Financial Resource Strain: Not on file  ?Food Insecurity: Not on file  ?Transportation Needs: Not on file  ?Physical Activity: Not  on file  ?Stress: Not on file  ?Social Connections: Not on file  ?Intimate Partner Violence: Not on file  ?: ? ?REVIEW OF SYSTEMS:  A 15 point review of systems is documented in the electronic medical record. This was obtained by the nursing staff. However, I reviewed this with the patient to discuss relevant findings and make appropriate changes.  Pertinent items are noted in HPI. ?  ?PHYSICAL EXAM:  ?Telephone assessment today by me and here  were the physical findings documented by Dr. Alen Blew earlier today. ?Blood pressure (!) 176/101, pulse 73, temperature 98.2 ?F (36.8 ?C), temperature source Temporal, resp. rate 17, height 6\' 2"  (1.88 m), weight 218 lb (98.9 kg), SpO2 97 %. ?ECOG: 0 ?General appearance: Comfortable appearing without any discomfort ?Head: Normocephalic without any trauma ?Oropharynx: Mucous membranes are moist and pink without any thrush or ulcers. ?Eyes: Pupils are equal and round reactive to light. ?Lymph nodes: No cervical, supraclavicular, inguinal or axillary lymphadenopathy.   ?Heart:regular rate and rhythm.  S1 and S2 without leg edema. ?Lung: Clear without any rhonchi or wheezes.  No dullness to percussion. ?Abdomin: Soft, nontender, nondistended with good bowel sounds.  No hepatosplenomegaly. ?Musculoskeletal: No joint deformity or effusion.  Full range of motion noted. ?Neurological: No deficits noted on motor, sensory and deep tendon reflex exam. ?Skin: No petechial rash or dryness.  Appeared moist ? ? ?KPS = 80 ? ?100 - Normal; no complaints; no evidence of disease. ?90   - Able to carry on normal activity; minor signs or symptoms of disease. ?80   - Normal activity with effort; some signs or symptoms of disease. ?2   - Cares for self; unable to carry on normal activity or to do active work. ?60   - Requires occasional assistance, but is able to care for most of his personal needs. ?50   - Requires considerable assistance and frequent medical care. ?49   - Disabled; requires special  care and assistance. ?30   - Severely disabled; hospital admission is indicated although death not imminent. ?20   - Very sick; hospital admission necessary; active supportive treatment necessary. ?1

## 2021-07-09 NOTE — Progress Notes (Signed)
DISCONTINUE ON PATHWAY REGIMEN - Melanoma and Other Skin Cancers ? ? ?  A cycle is every 42 days: ?    Pembrolizumab  ? ?**Always confirm dose/schedule in your pharmacy ordering system** ? ?REASON: Disease Progression ?PRIOR TREATMENT: [id number]: Pembrolizumab 400 mg q42 Days for up to 1 Year of Therapy ?TREATMENT RESPONSE: Stable Disease (SD) ? ?START ON PATHWAY REGIMEN - Melanoma and Other Skin Cancers ? ? ?  A cycle is every 21 days: ?    Nivolumab  ?    Ipilimumab  ?  A cycle is every 28 days: ?    Nivolumab  ? ?**Always confirm dose/schedule in your pharmacy ordering system** ? ?Patient Characteristics: ?Melanoma, Cutaneous/Unknown Primary, Distant Metastases, Unresectable, No Brain Metastases, First Line, BRAF V600 Wild Type / BRAF V600 Results Pending or Unknown, Candidate for Immunotherapy ?Disease Classification: Melanoma ?Disease Subtype: Cutaneous ?BRAF V600 Mutation Status: Awaiting BRAF V600 Results ?Therapeutic Status: Distant Metastases ?Line of Therapy: First Line ?Immunotherapy Candidate Status: Candidate for Immunotherapy ?Intent of Therapy: ?Non-Curative / Palliative Intent, Discussed with Patient ?

## 2021-07-10 ENCOUNTER — Ambulatory Visit: Payer: Federal, State, Local not specified - PPO | Admitting: Radiation Oncology

## 2021-07-10 ENCOUNTER — Ambulatory Visit: Payer: Federal, State, Local not specified - PPO

## 2021-07-11 ENCOUNTER — Ambulatory Visit: Payer: Federal, State, Local not specified - PPO

## 2021-07-12 ENCOUNTER — Ambulatory Visit: Payer: Federal, State, Local not specified - PPO

## 2021-07-15 ENCOUNTER — Ambulatory Visit: Payer: Federal, State, Local not specified - PPO

## 2021-07-15 ENCOUNTER — Telehealth: Payer: Self-pay | Admitting: *Deleted

## 2021-07-15 ENCOUNTER — Telehealth: Payer: Self-pay | Admitting: Dermatology

## 2021-07-15 ENCOUNTER — Inpatient Hospital Stay: Payer: Federal, State, Local not specified - PPO | Admitting: Oncology

## 2021-07-15 NOTE — Telephone Encounter (Signed)
Returned PC to patient's wife, Kurtis Bushman, informed of Dr. Hazeline Junker recommendation as below, also informed Dr. Alen Blew can do a phone visit with her & the patient on 07/25/21 at 3:45.  She verbalizes understanding.  Scheduling message sent to change appointments.

## 2021-07-15 NOTE — Telephone Encounter (Signed)
Patient's wife called to say that Jared Burton was diagnosed with a level 3 melanoma and that it metastasized to bones and he just had surgery on bones.

## 2021-07-15 NOTE — Telephone Encounter (Signed)
-----   Message from Wyatt Portela, MD sent at 07/15/2021 10:40 AM EDT ----- His appointment can be changed to 3:45 on 6/1 via phone. No labs or infusion at this time.  ----- Message ----- From: Rolene Course, RN Sent: 07/15/2021  10:25 AM EDT To: Wyatt Portela, MD  I called & let her know, she is asking if his appointment can possibly be changed to after 3:00 on 5/31 so she can come as well.  ----- Message ----- From: Wyatt Portela, MD Sent: 07/15/2021  10:06 AM EDT To: Rolene Course, RN  This will be addressed with visit next week. Once he is healed from surgery, he will get radiation to the femur and lung spot. His pathology will be compared to the original.  ----- Message ----- From: Rolene Course, RN Sent: 07/15/2021   9:29 AM EDT To: Wyatt Portela, MD  Mr [facility] wife called this morning with several questions. She said he had his surgery on Thursday & should be DC'd home today, so he will not be here for his appointment today.  She said that Dr. Leonides Schanz has recommended that radiation tx be held x 5 weeks.  She said his surgical pathology showed that this is melanoma in his femur & she is asking if that will be compared to the pathology that was done previously.  She also wants to know what the plan is going forward regarding the nodule in his lung.  Please advise.  Thanks, Bethena Roys

## 2021-07-16 ENCOUNTER — Ambulatory Visit: Payer: Federal, State, Local not specified - PPO

## 2021-07-17 ENCOUNTER — Ambulatory Visit: Payer: Federal, State, Local not specified - PPO

## 2021-07-18 ENCOUNTER — Ambulatory Visit: Payer: Federal, State, Local not specified - PPO

## 2021-07-19 ENCOUNTER — Ambulatory Visit: Payer: Federal, State, Local not specified - PPO

## 2021-07-23 ENCOUNTER — Ambulatory Visit: Payer: Federal, State, Local not specified - PPO

## 2021-07-24 ENCOUNTER — Ambulatory Visit: Payer: Federal, State, Local not specified - PPO

## 2021-07-24 ENCOUNTER — Other Ambulatory Visit: Payer: Federal, State, Local not specified - PPO

## 2021-07-24 ENCOUNTER — Ambulatory Visit: Payer: Federal, State, Local not specified - PPO | Admitting: Oncology

## 2021-07-25 ENCOUNTER — Telehealth: Payer: Self-pay | Admitting: *Deleted

## 2021-07-25 ENCOUNTER — Ambulatory Visit: Payer: Federal, State, Local not specified - PPO

## 2021-07-25 ENCOUNTER — Inpatient Hospital Stay: Payer: Federal, State, Local not specified - PPO | Attending: Oncology | Admitting: Oncology

## 2021-07-25 DIAGNOSIS — C439 Malignant melanoma of skin, unspecified: Secondary | ICD-10-CM

## 2021-07-25 NOTE — Telephone Encounter (Signed)
Referral called to [facility] Dr [facility]: Metastatic melanoma. Office notes and demographics faxed.

## 2021-07-25 NOTE — Progress Notes (Signed)
Hematology and Oncology Follow Up for Telemedicine Visits  Jared Burton [id number] May 16, 1965 56 y.o. 07/25/2021 12:47 PM Jared Burton, MDBadger, Jared Alert, MD   I connected with Jared Burton on 07/25/21 at  3:45 PM EDT by telephone visit and verified that I am speaking with the correct person using two identifiers.   I discussed the limitations, risks, security and privacy concerns of performing an evaluation and management service by telemedicine and the availability of in-person appointments. I also discussed with the patient that there may be a patient responsible charge related to this service. The patient expressed understanding and agreed to proceed.  Other persons participating in the visit and their role in the encounter:    Patient's location: Home Provider's location: Office    Principle Diagnosis: 56 year old man with melanoma of the left lower extremity diagnosed in May 2022.  He was found to have T2N1 disease that is BRAF positive.  He developed mid femoral shaft metastatic lesion in May 2023.     Prior Therapy: He underwent a wide excision of the left thigh melanoma as well as a left inguinal lymph node dissection on July 26, 2020.  The final pathology showed no residual melanoma with 1 out of 10 lymph nodes showed metastatic disease.    Pembrolizumab 400 mg every 6 weeks started on September 18, 2020.  He completed 7 cycles of therapy.  He is status post resection of tumor from his left femur completed on Jul 11, 2021 by Jared Burton.  The final pathology showed metastatic melanoma.  BRAF and molecular studies are currently pending.    Current therapy: Under evaluation for additional therapy.      Interim History: Jared Burton reports recovering well from his recent surgery and increased bearing weight including using crutches and walker.  He is not taking any narcotic pain medication and uses Tylenol predominantly.  He denies any recent falls or syncope.  His performance  status is improving at this time.    Medications: I have reviewed the patient's current medications.  Current Outpatient Medications  Medication Sig Dispense Refill   acetaminophen (TYLENOL) 500 MG tablet Take by mouth.     fentaNYL (DURAGESIC) 25 MCG/HR Place 1 patch onto the skin every 3 (three) days. 10 patch 0   ibuprofen (ADVIL) 200 MG tablet Take by mouth.     IBUPROFEN PO Take by mouth. As needed     levocetirizine (XYZAL) 5 MG tablet Take 5 mg by mouth every evening.     levothyroxine (SYNTHROID) 50 MCG tablet Take 2 tablets (100 mcg total) by mouth daily before breakfast. 90 tablet 3   oxyCODONE (OXY IR/ROXICODONE) 5 MG immediate release tablet Take 1 tablet (5 mg total) by mouth every 4 (four) hours as needed for severe pain. 30 tablet 0   prochlorperazine (COMPAZINE) 10 MG tablet Take 1 tablet (10 mg total) by mouth every 6 (six) hours as needed for nausea or vomiting. 30 tablet 3   traMADol (ULTRAM) 50 MG tablet Take 1 tablet (50 mg total) by mouth every 6 (six) hours as needed. 30 tablet 1   No current facility-administered medications for this visit.        Lab Results: Lab Results  Component Value Date   WBC 13.4 (H) 07/06/2021   HGB 15.8 07/06/2021   HCT 45.2 07/06/2021   MCV 86.8 07/06/2021   PLT 294 07/06/2021     Chemistry      Component Value Date/Time   NA 132 (L)  07/06/2021 1944   K 3.4 (L) 07/06/2021 1944   CL 97 (L) 07/06/2021 1944   CO2 25 07/06/2021 1944   BUN 23 (H) 07/06/2021 1944   CREATININE 1.26 (H) 07/06/2021 1944   CREATININE 1.37 (H) 06/11/2021 1234      Component Value Date/Time   CALCIUM 9.1 07/06/2021 1944   ALKPHOS 63 07/06/2021 1944   AST 18 07/06/2021 1944   AST 16 06/11/2021 1234   ALT 20 07/06/2021 1944   ALT 10 06/11/2021 1234   BILITOT 1.2 07/06/2021 1944   BILITOT 0.8 06/11/2021 1234         Impression and Plan:   1.  Left lower extremity melanoma diagnosed in June 2021.  He was found to have T2aN1 disease and  developed relapse in the right femur.     Management options from a systemic therapy standpoint were discussed at this time.  Using combination immunotherapy with ipilimumab and nivolumab, BRAF targeted therapy versus alternative options such as TIL therapy were reviewed.  It is concerning that he developed metastatic disease into the right femur while on adjuvant immunotherapy although it is unclear to me that his tumor is immunotherapy refractory.  After discussion today, I offered referral to [facility] for evaluation regarding second opinion.  He is agreeable at this time we will make the appropriate referral.   2.  Right femoral lesion: He is status post surgical resection and followed by radiation therapy under the care of Dr. Tammi Klippel.     3.  Hypothyroidism: He is currently on Synthroid replacement and will be monitored place as needed.   4.  Pulmonary nodules: Likely metastatic disease and can be treated with ablative radiation therapy as well.   5.  Prognosis: His disease is likely incurable despite his oligoprogression and has been surgically treated.  Aggressive measures however are recommended.   6.  Follow-up: Will be in the next few weeks for repeat evaluation.     I provided 20 minutes of non face-to-face telephone visit time during this encounter.  The time was dedicated to reviewing pathology results, treatment choices and answering questions regarding prognosis and future plan of care. Jared Button, MD 07/25/2021 12:47 PM

## 2021-08-01 ENCOUNTER — Telehealth: Payer: Self-pay | Admitting: Oncology

## 2021-08-01 NOTE — Telephone Encounter (Signed)
Called patient regarding upcoming June appointment, left a voicemail.

## 2021-08-05 ENCOUNTER — Ambulatory Visit: Payer: Federal, State, Local not specified - PPO | Admitting: Radiation Oncology

## 2021-08-06 ENCOUNTER — Other Ambulatory Visit (HOSPITAL_COMMUNITY): Payer: Self-pay | Admitting: Specialist

## 2021-08-06 ENCOUNTER — Other Ambulatory Visit: Payer: Self-pay | Admitting: Specialist

## 2021-08-06 DIAGNOSIS — C439 Malignant melanoma of skin, unspecified: Secondary | ICD-10-CM

## 2021-08-11 ENCOUNTER — Ambulatory Visit (HOSPITAL_COMMUNITY)
Admission: RE | Admit: 2021-08-11 | Discharge: 2021-08-11 | Disposition: A | Discharge status: Home / Self Care | Payer: Federal, State, Local not specified - PPO | Source: Ambulatory Visit | Attending: Specialist | Admitting: Specialist

## 2021-08-11 DIAGNOSIS — C439 Malignant melanoma of skin, unspecified: Secondary | ICD-10-CM | POA: Insufficient documentation

## 2021-08-11 MED ORDER — GADOBUTROL 1 MMOL/ML IV SOLN
9.8000 mL | Freq: Once | INTRAVENOUS | Status: AC | PRN
  Administered 2021-08-11: 9.8 mL via INTRAVENOUS

## 2021-08-12 ENCOUNTER — Inpatient Hospital Stay (HOSPITAL_BASED_OUTPATIENT_CLINIC_OR_DEPARTMENT_OTHER): Payer: Federal, State, Local not specified - PPO | Admitting: Oncology

## 2021-08-12 DIAGNOSIS — C439 Malignant melanoma of skin, unspecified: Secondary | ICD-10-CM | POA: Diagnosis not present

## 2021-08-12 NOTE — Progress Notes (Signed)
Hematology and Oncology Follow Up for Telemedicine Visits  Jared Burton [id number] [birth year] 56 y.o. 08/12/2021 11:43 AM Jared Burton, MDBadger, Jared Alert, MD   I connected with Jared Burton on 08/12/21 at 11:30 AM EDT by telephone visit and verified that I am speaking with the correct person using two identifiers.   I discussed the limitations, risks, security and privacy concerns of performing an evaluation and management service by telemedicine and the availability of in-person appointments. I also discussed with the patient that there may be a patient responsible charge related to this service. The patient expressed understanding and agreed to proceed.  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   He presented with T2N1 in June 2022.    Prior Therapy: He underwent a wide excision of the left thigh melanoma as well as a left inguinal lymph node dissection on July 26, 2020.  The final pathology showed no residual melanoma with 1 out of 10 lymph nodes showed metastatic disease.    Pembrolizumab 400 mg every 6 weeks started on September 18, 2020.  He completed 7 cycles of therapy.  He is status post resection of tumor from his left femur completed on Jul 11, 2021 by Dr. Leonides Burton.  The final pathology showed metastatic melanoma.  BRAF and molecular studies are currently pending.    Current therapy: Under evaluation for additional therapy.      Interim History: Jared Burton reports continuous improvement in his overall health.  He for ambulation using crutches although he is bearing more weight on his leg.  He denies any falls or syncope.  His pain has improved.    Medications:  updated on review. Current Outpatient  Medications  Medication Sig Dispense Refill   acetaminophen (TYLENOL) 500 MG tablet Take by mouth.     fentaNYL (DURAGESIC) 25 MCG/HR Place 1 patch onto the skin every 3 (three) days. 10 patch 0   ibuprofen (ADVIL) 200 MG tablet Take by mouth.     IBUPROFEN PO Take by mouth. As needed     levocetirizine (XYZAL) 5 MG tablet Take 5 mg by mouth every evening.     levothyroxine (SYNTHROID) 50 MCG tablet Take 2 tablets (100 mcg total) by mouth daily before breakfast. 90 tablet 3   oxyCODONE (OXY IR/ROXICODONE) 5 MG immediate release tablet Take 1 tablet (5 mg total) by mouth every 4 (four) hours as needed for severe pain. 30 tablet 0   prochlorperazine (COMPAZINE) 10 MG tablet Take 1 tablet (10 mg total) by mouth every 6 (six) hours as needed for nausea or vomiting. 30 tablet 3   traMADol (ULTRAM) 50 MG tablet Take 1 tablet (50 mg total) by mouth every 6 (six) hours as needed. 30 tablet 1   No current facility-administered medications for this visit.        Lab Results: Lab Results  Component Value Date   WBC 13.4 (H) 07/06/2021   HGB 15.8 07/06/2021   HCT 45.2 07/06/2021   MCV 86.8 07/06/2021   PLT 294 07/06/2021     Chemistry      Component Value Date/Time   NA 132 (L) 07/06/2021 1944   K 3.4 (L) 07/06/2021 1944   CL 97 (L) 07/06/2021 1944   CO2 25 07/06/2021  1944   BUN 23 (H) 07/06/2021 1944   CREATININE 1.26 (H) 07/06/2021 1944   CREATININE 1.37 (H) 06/11/2021 1234      Component Value Date/Time   CALCIUM 9.1 07/06/2021 1944   ALKPHOS 63 07/06/2021 1944   AST 18 07/06/2021 1944   AST 16 06/11/2021 1234   ALT 20 07/06/2021 1944   ALT 10 06/11/2021 1234   BILITOT 1.2 07/06/2021 1944   BILITOT 0.8 06/11/2021 1234         Impression and Plan:   1.  Stage IV melanoma arising from left lower with documented femoral metastasis and isolated pulmonary nodule in 2023.  He presented with stage III disease in 2022.    The natural course of this disease was reviewed again  with the patient and his wife.  Salvage therapy with ipilimumab and nivolumab remains his best option and this was reiterated during consultation with at [facility].  4 cycles of ipilimumab and nivolumab was recommended and I certainly agree with that.  I have offered the patient the opportunity in the option to be treated locally but he opted against it at this time and wants to be treated at [facility].  I agree with MRI of the brain which we also arranged in the near future.        2.  Right femoral lesion: We will require adjuvant radiation therapy in the future once is fully healed.     3.  Hypothyroidism: I recommended continuing Synthroid replacement.  Periodic TSH monitoring is dissipated with his immunotherapy.   4.  Pulmonary nodules: As recommended not to treat this and use this as a marker and no additional treatment be on immunotherapy will be given.    5.  Follow-up: I am happy to see him in the future as needed given his decision to resume his care at [facility].    I provided 20 minutes of non face-to-face telephone visit time during this encounter.  The time was spent on updating his disease status, treatment choices and future plan of care discussion.  Jared Button, MD 08/12/2021 11:43 AM

## 2021-09-02 ENCOUNTER — Encounter: Payer: Self-pay | Admitting: Dermatology

## 2021-09-02 ENCOUNTER — Ambulatory Visit (INDEPENDENT_AMBULATORY_CARE_PROVIDER_SITE_OTHER): Payer: Federal, State, Local not specified - PPO | Admitting: Dermatology

## 2021-09-02 DIAGNOSIS — Z85828 Personal history of other malignant neoplasm of skin: Secondary | ICD-10-CM

## 2021-09-02 DIAGNOSIS — Z86018 Personal history of other benign neoplasm: Secondary | ICD-10-CM

## 2021-09-02 DIAGNOSIS — C4371 Malignant melanoma of right lower limb, including hip: Secondary | ICD-10-CM

## 2021-09-02 DIAGNOSIS — C439 Malignant melanoma of skin, unspecified: Secondary | ICD-10-CM

## 2021-09-02 DIAGNOSIS — Z1283 Encounter for screening for malignant neoplasm of skin: Secondary | ICD-10-CM | POA: Diagnosis not present

## 2021-09-03 ENCOUNTER — Encounter: Payer: Self-pay | Admitting: Dermatology

## 2021-09-03 NOTE — Progress Notes (Signed)
   Follow-Up Visit   Subjective  Jared Burton is a 56 y.o. male who presents for the following: Annual Exam (Personal history of bcc and atypia ).  Examination, stage IV melanoma since last seen Location:  Duration:  Quality:  Associated Signs/Symptoms: Modifying Factors:  Severity:  Timing: Context:   Objective  Well appearing patient in no apparent distress; mood and affect are within normal limits. No atypical nevi or signs of NMSC noted at the time of the visit. Keratoses on the chest and upper right arm checked with dermoscopy and require no current intervention.     Right Thigh - Anterior Jared Burton has been through a very difficult medical time since we last spoke.  I reviewed his medical records.  He developed extremely painful metastatic melanoma to the right femur which was treated surgically with successful pain reduction.  He had initial consultation with a melanoma oncologist in [facility] who will see him again to initiate what is likely new immunotherapy.  I discussed in some detail with the patient studies involving TILs is a possible alternative therapy.    A full examination was performed including scalp, head, eyes, ears, nose, lips, neck, chest, axillae, abdomen, back, buttocks, bilateral upper extremities, bilateral lower extremities, hands, feet, fingers, toes, fingernails, and toenails. All findings within normal limits unless otherwise noted below.   Assessment & Plan    Screening exam for skin cancer  Although I think it is reasonable for Jared Burton to continue to get an annual general skin examination, this will not affect his plans for therapy for his stage IV melanoma.  Metastatic melanoma ([place]) Right Thigh - Anterior  Continued with systemic therapy under the direction of [facility] oncologist.      I, Lavonna Monarch, MD, have reviewed all documentation for this visit.  The documentation on 09/03/21 for the exam, diagnosis, procedures, and  orders are all accurate and complete.

## 2021-09-12 ENCOUNTER — Other Ambulatory Visit: Payer: Self-pay | Admitting: Specialist

## 2021-09-12 ENCOUNTER — Other Ambulatory Visit (HOSPITAL_COMMUNITY): Payer: Self-pay | Admitting: Specialist

## 2021-09-12 DIAGNOSIS — C7951 Secondary malignant neoplasm of bone: Secondary | ICD-10-CM

## 2021-09-20 ENCOUNTER — Other Ambulatory Visit (HOSPITAL_COMMUNITY): Payer: Self-pay | Admitting: Specialist

## 2021-09-20 DIAGNOSIS — C7951 Secondary malignant neoplasm of bone: Secondary | ICD-10-CM

## 2021-09-26 ENCOUNTER — Encounter (HOSPITAL_COMMUNITY)
Admission: RE | Admit: 2021-09-26 | Discharge: 2021-09-26 | Disposition: A | Discharge status: Home / Self Care | Payer: Federal, State, Local not specified - PPO | Source: Ambulatory Visit | Attending: Specialist | Admitting: Specialist

## 2021-09-26 DIAGNOSIS — C7951 Secondary malignant neoplasm of bone: Secondary | ICD-10-CM | POA: Insufficient documentation

## 2021-09-26 LAB — GLUCOSE, CAPILLARY: Glucose-Capillary: 111 mg/dL — ABNORMAL HIGH (ref 70–99)

## 2021-09-26 MED ORDER — FLUDEOXYGLUCOSE F - 18 (FDG) INJECTION
11.5000 | Freq: Once | INTRAVENOUS | Status: AC
  Administered 2021-09-26: 11.5 via INTRAVENOUS

## 2021-10-17 ENCOUNTER — Telehealth: Payer: Self-pay | Admitting: Radiation Oncology

## 2021-10-17 NOTE — Telephone Encounter (Signed)
Called patient to schedule a follow up appointment w. Dr. Tammi Klippel. No answer, LVM for a return call.

## 2021-10-23 ENCOUNTER — Encounter: Payer: Self-pay | Admitting: Urology

## 2021-10-23 NOTE — Progress Notes (Signed)
Telephone appointment. I spoke w/ spouse Mrs. Isidro Monks, verified her identity and began nursing interview. Spouse reports patient is experiencing some RT leg pain 5/10, managed w/ pain meds on file. No other issues reported at this time.  Meaningful use complete.  Reminded spouse of patient's 11"30am-10/24/21 telephone appointment w/ Ashlyn Bruning PA-C. I left my extension [phone] in case patient needs anything. Spouse verbalized understanding.  Patient contact [phone] or [phone]

## 2021-10-24 ENCOUNTER — Ambulatory Visit
Admission: RE | Admit: 2021-10-24 | Discharge: 2021-10-24 | Disposition: A | Discharge status: Home / Self Care | Payer: Federal, State, Local not specified - PPO | Source: Ambulatory Visit | Attending: Radiation Oncology | Admitting: Radiation Oncology

## 2021-10-24 ENCOUNTER — Ambulatory Visit
Admission: RE | Admit: 2021-10-24 | Discharge: 2021-10-24 | Disposition: A | Discharge status: Home / Self Care | Payer: Federal, State, Local not specified - PPO | Source: Ambulatory Visit | Attending: Urology | Admitting: Urology

## 2021-10-24 DIAGNOSIS — C4372 Malignant melanoma of left lower limb, including hip: Secondary | ICD-10-CM | POA: Diagnosis present

## 2021-10-24 DIAGNOSIS — C4021 Malignant neoplasm of long bones of right lower limb: Secondary | ICD-10-CM

## 2021-10-24 DIAGNOSIS — C7951 Secondary malignant neoplasm of bone: Secondary | ICD-10-CM | POA: Insufficient documentation

## 2021-10-24 DIAGNOSIS — C439 Malignant melanoma of skin, unspecified: Secondary | ICD-10-CM

## 2021-10-25 NOTE — Progress Notes (Signed)
  Radiation Oncology         [phone]) [phone] ________________________________  Name: Andrej Spagnoli MRN: [id number]  Date: 10/24/2021  DOB: 03-Jul-1965  SIMULATION AND TREATMENT PLANNING NOTE    ICD-10-CM   1. 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  Identity was confirmed.  All relevant records and images related to the planned course of therapy were reviewed.  The patient freely provided informed written consent to proceed with treatment after reviewing the details related to the planned course of therapy. The consent form was witnessed and verified by the simulation staff.  Then, the patient was set-up in a stable reproducible  supine position for radiation therapy.  CT images were obtained.  Surface markings were placed.  The CT images were loaded into the planning software.  Then the target and avoidance structures were contoured.  Treatment planning then occurred.  The radiation prescription was entered and confirmed.  Then, I designed and supervised the construction of a total of multiple medically necessary complex treatment devices including body positioner and MLCs to shield critical genitals and dermal lymphatics.  I have requested : Isodose Plan.    PLAN:  The patient will receive 30 Gy in 10 fractions.  ________________________________  Sheral Apley Tammi Klippel, M.D.

## 2021-10-29 NOTE — Progress Notes (Signed)
[facility]         [phone] ________________________________  Outpatient Re-Consultation - Conducted via telephone due to current COVID-19 concerns for limiting patient exposure  Name: Kesley Gaffey MRN: [id number]  Date: 10/24/2021  DOB: 1965-12-13  REFERRING PHYSICIAN: Ward, Julieta Bellini., MD  DIAGNOSIS: 56 yo man with s/p ORIF of a painful right femur metastasis from melanoma of the left lower extremity    ICD-10-CM   1. Malignant neoplasm of right femur ([place])  C40.21       HISTORY OF PRESENT ILLNESS::Jaxtyn Christmas is a 56 y.o. male who underwent a wide excision of the left thigh, proven melanoma as well as a left inguinal lymph node dissection on July 26, 2020.  The final pathology showed no residual melanoma with 1 out of 10 lymph nodes positive. He has subsequently been receiving Pembrolizumab 400 mg every 6 weeks started on September 18, 2020 and he has completed 7 cycles of therapy.  In Jan. 2023, he started to develop pain in his right thigh localizing in the quadriceps, progressively worsening and was evaluated by orthopedics in [place] at [facility]. An x-ray of the right femur showed a suspicious lesion.  MRI of the thigh showed an aggressive appearing marrow replacing lesion in the right proximal to mid femoral diaphysis measuring 15.2 cm without soft tissue extension.  Based on these findings he was evaluated by Dr. Leonides Schanz at [facility] and currently under evaluation for possible surgical intervention versus radiation therapy.  In the meantime, he continued to have increased pain and was seen in the emergency department on Jul 06, 2021.  He completed a PET scan on Jul 08, 2021 which showed a hypermetabolic right femoral lesion as well as a new 1 cm hypermetabolic right middle lobe lung nodule.   We initially met him in consult on 07/09/21 to discuss treatment options and his pain was improved with fentanyl patch and oxycodone for breakthrough pain, limiting  his weightbearing utilizing crutches.  Dr. Leonides Schanz had initially recommended radiotherapy but within a day or two of our visit, he called and had decided to proceed with surgical resection and stabilization with IM nail in the right femur.  The procedure was completed on 07/11/21 and his incisions have fully healed. He has been referred back to [place] today for re-consultation to discuss post-operative radiation to the right femur.    PREVIOUS RADIATION THERAPY: No  Past Medical History:  Diagnosis Date   Allergy    Atypical nevus 09/12/2009   Mid Lower - Moderate to Severe   Atypical nevus 09/12/2009   Left Medial Leg - Severe/Evolution of Melanoma in Situ   BCC (basal cell carcinoma of skin) 11/11/2006   Left Upper Arm   BCC (basal cell carcinoma of skin) 11/11/2006   Left [place]   BCC (basal cell carcinoma of skin) 11/11/2006   Right Front Shoulder   BCC (basal cell carcinoma of skin) 11/11/2006   Right Arm Crease/Elbow   BCC (basal cell carcinoma of skin) 09/12/2009   Left Shoulder   BCC (basal cell carcinoma of skin) 09/12/2009   Left Lower Back   BCC (basal cell carcinoma of skin) 12/12/2009   Left Top Shoulder   BCC (basal cell carcinoma of skin) 05/23/2013   Left Posterior Auricular, Right Chest, Right Upper Back, Right Mid Back Lateral, Right Mid Back Medial, Left Upper Arm, Left Calf   BCC (basal cell carcinoma of skin) Ulcerated 09/12/2009   Right Lateral Leg   BCC (basal  cell carcinoma of skin) Ulcerated and Infiltrative 09/12/2009   Right Hand   Infiltrative basal cell carcinoma (BCC) 06/02/2017   Left Tip of Nose   Nodular basal cell carcinoma (BCC) 06/02/2017   Mid Upper Back, Left Nare, Right Upper Arm Inferior   Superficial basal cell carcinoma (BCC) 11/11/2006   Left Upper Back   Superficial basal cell carcinoma (BCC) 11/11/2006   Mid Upper Chest   Superficial basal cell carcinoma (BCC) 06/02/2017    Right Upper Arm Superior, Back Left Lower Thigh, Back Right Knee  Crease   Superficial basal cell carcinoma (BCC) 06/02/2017   Left Ear Rim   Superficial nodular basal cell carcinoma (BCC) 06/02/2017   Right Inner Deltoid  :   Past Surgical History:  Procedure Laterality Date   TONSILLECTOMY     as a child   WRIST FRACTURE SURGERY     as a child  :   Current Outpatient Medications:    acetaminophen (TYLENOL) 500 MG tablet, Take by mouth., Disp: , Rfl:    fentaNYL (DURAGESIC) 25 MCG/HR, Place 1 patch onto the skin every 3 (three) days. (Patient not taking: Reported on 09/02/2021), Disp: 10 patch, Rfl: 0   ibuprofen (ADVIL) 200 MG tablet, Take by mouth., Disp: , Rfl:    IBUPROFEN PO, Take by mouth. As needed (Patient not taking: Reported on 09/02/2021), Disp: , Rfl:    levocetirizine (XYZAL) 5 MG tablet, Take 5 mg by mouth every evening., Disp: , Rfl:    levothyroxine (SYNTHROID) 50 MCG tablet, Take 2 tablets (100 mcg total) by mouth daily before breakfast., Disp: 90 tablet, Rfl: 3   oxyCODONE (OXY IR/ROXICODONE) 5 MG immediate release tablet, Take 1 tablet (5 mg total) by mouth every 4 (four) hours as needed for severe pain. (Patient not taking: Reported on 09/02/2021), Disp: 30 tablet, Rfl: 0   prochlorperazine (COMPAZINE) 10 MG tablet, Take 1 tablet (10 mg total) by mouth every 6 (six) hours as needed for nausea or vomiting. (Patient not taking: Reported on 09/02/2021), Disp: 30 tablet, Rfl: 3   traMADol (ULTRAM) 50 MG tablet, Take 1 tablet (50 mg total) by mouth every 6 (six) hours as needed. (Patient not taking: Reported on 09/02/2021), Disp: 30 tablet, Rfl: 1:  No Known Allergies:   Family History  Problem Relation Age of Onset   Cancer Mother    Colon cancer Neg Hx    Colon polyps Neg Hx    Esophageal cancer Neg Hx    Stomach cancer Neg Hx    Rectal cancer Neg Hx   :   Social History   Socioeconomic History   Marital status: Married    Spouse name: Not on file   Number of children: Not on file   Years of education: Not on file    Highest education level: Not on file  Occupational History   Not on file  Tobacco Use   Smoking status: Never   Smokeless tobacco: Never  Vaping Use   Vaping Use: Never used  Substance and Sexual Activity   Alcohol use: Yes    Comment: a few drinks / week (beer and wine)   Drug use: Never   Sexual activity: Not on file  Other Topics Concern   Not on file  Social History Narrative   Not on file   Social Determinants of Health   Financial Resource Strain: Not on file  Food Insecurity: Not on file  Transportation Needs: Not on file  Physical Activity: Not on file  Stress: Not on file  Social Connections: Not on file  Intimate Partner Violence: Not on file  :  REVIEW OF SYSTEMS:  On review of systems, the patient reports that he is doing well overall. He denies any chest pain, shortness of breath, cough, fevers, chills, night sweats, unintended weight changes. He denies any bowel or bladder disturbances, and denies abdominal pain, nausea or vomiting. He reports that the right femur incision is well healed and he denies any new musculoskeletal or joint aches or pains, new skin lesions or concerns. He is able to ambulate without crutches and right femur pain is well controlled. A complete review of systems is obtained and is otherwise negative.    PHYSICAL EXAM:  Unable to assess due to telephone follow up visit format.  KPS = 80  100 - Normal; no complaints; no evidence of disease. 90   - Able to carry on normal activity; minor signs or symptoms of disease. 80   - Normal activity with effort; some signs or symptoms of disease. 53   - Cares for self; unable to carry on normal activity or to do active work. 60   - Requires occasional assistance, but is able to care for most of his personal needs. 50   - Requires considerable assistance and frequent medical care. 4   - Disabled; requires special care and assistance. 80   - Severely disabled; hospital admission is indicated  although death not imminent. 13   - Very sick; hospital admission necessary; active supportive treatment necessary. 10   - Moribund; fatal processes progressing rapidly. 0     - Dead  Karnofsky DA, [place], Craver LS and [facility] [phone]) The use of the nitrogen mustards in the palliative treatment of carcinoma: with particular reference to bronchogenic carcinoma Cancer 1 [phone]  LABORATORY DATA:  Lab Results  Component Value Date   WBC 13.4 (H) 07/06/2021   HGB 15.8 07/06/2021   HCT 45.2 07/06/2021   MCV 86.8 07/06/2021   PLT 294 07/06/2021   Lab Results  Component Value Date   NA 132 (L) 07/06/2021   K 3.4 (L) 07/06/2021   CL 97 (L) 07/06/2021   CO2 25 07/06/2021   Lab Results  Component Value Date   ALT 20 07/06/2021   AST 18 07/06/2021   ALKPHOS 63 07/06/2021   BILITOT 1.2 07/06/2021     RADIOGRAPHY: No results found.    IMPRESSION/PLAN: This visit was conducted via telephone to spare the patient unnecessary potential exposure in the healthcare setting during the current COVID-19 pandemic. 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 Today, We talked to the patient about the findings and work-up thus far.  We discussed the natural history of oligometastatic melanoma and general treatment, highlighting the role of radiotherapy in the management.  His most recent right femur x-rays from 10/09/21 show a stable lesion and post-operative changes in the right femur. We discussed the available radiation techniques, and focused on the details of logistics and delivery.  The recommendation is for a 2 week course of daily adjuvant radiation to the postoperative field and hardware. We reviewed the anticipated acute and late sequelae associated with radiation in this setting.  The patient was encouraged to ask questions that were answered to his stated satisfaction.   At the conclusion of our conversation, he is in agreement to proceed  with the recommended 2 week course of daily radiation to the postoperative field and hardware  in the right femur. He appears to have a good understanding of his disease and our treatment recommendations which are of curative intent. We will share our discussion with Dr. Alen Blew, Dr. Leonides Schanz and his medical oncology team at [facility] and proceed with treatment planning accordingly. He is scheduled for CT SIM this afternoon at 2pm in anticipation of beginning his daily treatments in the near future.  2. Metastatic lung lesion. He had a 1 cm hypermetabolic right middle lobe lung nodule noted on recent PET scan. His medical oncologist, Dr. Thyra Breed has recommended that the lung lesion be left alone for now, to use as an index lesion to assess treatment response to Ipilimumab/Nivolumab treatment that was just recently started on 10/11/21. We will follow expectantly as this will be primarily monitored/managed by medical oncology though we are happy to offer radiation if felt warranted in the future.  We personally spent 45 minutes in this encounter including chart review, reviewing radiological studies, meeting face-to-face with the patient, entering orders and completing documentation.  Given current concerns for patient exposure during the COVID-19 pandemic, this encounter was conducted via telephone. The patient was notified in advance and was offered a [place] meeting to allow for face to face communication but unfortunately reported that he did not have the appropriate resources/technology to support such a visit and instead preferred to proceed with telephone re-consult. The patient has given verbal consent for this type of encounter. The attendants for this meeting include Tyler Pita MD, Freeman Caldron, PA-C and the patient, Kirsten "[employer] During the encounter, Tyler Pita MD and Freeman Caldron, PA-C, were located at [facility] Radiation Oncology Department.  Patient, Charlies "[name]"  Elting, was located at home.    Nicholos Johns, PA-C    Tyler Pita, MD  [facility] Direct Dial: [phone]  Fax: [phone] [email or web address]  Skype  LinkedIn

## 2021-10-30 DIAGNOSIS — C7951 Secondary malignant neoplasm of bone: Secondary | ICD-10-CM | POA: Insufficient documentation

## 2021-10-30 DIAGNOSIS — C4372 Malignant melanoma of left lower limb, including hip: Secondary | ICD-10-CM | POA: Diagnosis present

## 2021-11-04 ENCOUNTER — Other Ambulatory Visit: Payer: Self-pay

## 2021-11-04 ENCOUNTER — Ambulatory Visit
Admission: RE | Admit: 2021-11-04 | Discharge: 2021-11-04 | Disposition: A | Discharge status: Home / Self Care | Payer: Federal, State, Local not specified - PPO | Source: Ambulatory Visit | Attending: Radiation Oncology | Admitting: Radiation Oncology

## 2021-11-04 DIAGNOSIS — C7951 Secondary malignant neoplasm of bone: Secondary | ICD-10-CM | POA: Diagnosis not present

## 2021-11-04 LAB — RAD ONC ARIA SESSION SUMMARY
Course Elapsed Days: 0
Plan Fractions Treated to Date: 1
Plan Prescribed Dose Per Fraction: 3 Gy
Plan Total Fractions Prescribed: 10
Plan Total Prescribed Dose: 30 Gy
Reference Point Dosage Given to Date: 3 Gy
Reference Point Session Dosage Given: 3 Gy
Session Number: 1

## 2021-11-05 ENCOUNTER — Other Ambulatory Visit: Payer: Self-pay

## 2021-11-05 ENCOUNTER — Ambulatory Visit
Admission: RE | Admit: 2021-11-05 | Discharge: 2021-11-05 | Disposition: A | Discharge status: Home / Self Care | Payer: Federal, State, Local not specified - PPO | Source: Ambulatory Visit | Attending: Radiation Oncology | Admitting: Radiation Oncology

## 2021-11-05 ENCOUNTER — Ambulatory Visit: Payer: Federal, State, Local not specified - PPO

## 2021-11-05 DIAGNOSIS — C7951 Secondary malignant neoplasm of bone: Secondary | ICD-10-CM | POA: Diagnosis not present

## 2021-11-05 LAB — RAD ONC ARIA SESSION SUMMARY
Course Elapsed Days: 1
Plan Fractions Treated to Date: 2
Plan Prescribed Dose Per Fraction: 3 Gy
Plan Total Fractions Prescribed: 10
Plan Total Prescribed Dose: 30 Gy
Reference Point Dosage Given to Date: 6 Gy
Reference Point Session Dosage Given: 3 Gy
Session Number: 2

## 2021-11-06 ENCOUNTER — Other Ambulatory Visit: Payer: Self-pay

## 2021-11-06 ENCOUNTER — Ambulatory Visit
Admission: RE | Admit: 2021-11-06 | Discharge: 2021-11-06 | Disposition: A | Discharge status: Home / Self Care | Payer: Federal, State, Local not specified - PPO | Source: Ambulatory Visit | Attending: Radiation Oncology | Admitting: Radiation Oncology

## 2021-11-06 ENCOUNTER — Ambulatory Visit: Payer: Federal, State, Local not specified - PPO

## 2021-11-06 DIAGNOSIS — C7951 Secondary malignant neoplasm of bone: Secondary | ICD-10-CM | POA: Diagnosis not present

## 2021-11-06 LAB — RAD ONC ARIA SESSION SUMMARY
Course Elapsed Days: 2
Plan Fractions Treated to Date: 3
Plan Prescribed Dose Per Fraction: 3 Gy
Plan Total Fractions Prescribed: 10
Plan Total Prescribed Dose: 30 Gy
Reference Point Dosage Given to Date: 9 Gy
Reference Point Session Dosage Given: 3 Gy
Session Number: 3

## 2021-11-07 ENCOUNTER — Ambulatory Visit
Admission: RE | Admit: 2021-11-07 | Discharge: 2021-11-07 | Disposition: A | Discharge status: Home / Self Care | Payer: Federal, State, Local not specified - PPO | Source: Ambulatory Visit | Attending: Radiation Oncology | Admitting: Radiation Oncology

## 2021-11-07 ENCOUNTER — Other Ambulatory Visit: Payer: Self-pay

## 2021-11-07 DIAGNOSIS — C7951 Secondary malignant neoplasm of bone: Secondary | ICD-10-CM | POA: Diagnosis not present

## 2021-11-07 LAB — RAD ONC ARIA SESSION SUMMARY
Course Elapsed Days: 3
Plan Fractions Treated to Date: 4
Plan Prescribed Dose Per Fraction: 3 Gy
Plan Total Fractions Prescribed: 10
Plan Total Prescribed Dose: 30 Gy
Reference Point Dosage Given to Date: 12 Gy
Reference Point Session Dosage Given: 3 Gy
Session Number: 4

## 2021-11-08 ENCOUNTER — Ambulatory Visit
Admission: RE | Admit: 2021-11-08 | Discharge: 2021-11-08 | Disposition: A | Discharge status: Home / Self Care | Payer: Federal, State, Local not specified - PPO | Source: Ambulatory Visit | Attending: Radiation Oncology | Admitting: Radiation Oncology

## 2021-11-08 ENCOUNTER — Other Ambulatory Visit: Payer: Self-pay

## 2021-11-08 DIAGNOSIS — C7951 Secondary malignant neoplasm of bone: Secondary | ICD-10-CM | POA: Diagnosis not present

## 2021-11-08 LAB — RAD ONC ARIA SESSION SUMMARY
Course Elapsed Days: 4
Plan Fractions Treated to Date: 5
Plan Prescribed Dose Per Fraction: 3 Gy
Plan Total Fractions Prescribed: 10
Plan Total Prescribed Dose: 30 Gy
Reference Point Dosage Given to Date: 15 Gy
Reference Point Session Dosage Given: 3 Gy
Session Number: 5

## 2021-11-11 ENCOUNTER — Ambulatory Visit
Admission: RE | Admit: 2021-11-11 | Discharge: 2021-11-11 | Disposition: A | Discharge status: Home / Self Care | Payer: Federal, State, Local not specified - PPO | Source: Ambulatory Visit | Attending: Radiation Oncology | Admitting: Radiation Oncology

## 2021-11-11 ENCOUNTER — Other Ambulatory Visit: Payer: Self-pay

## 2021-11-11 DIAGNOSIS — C7951 Secondary malignant neoplasm of bone: Secondary | ICD-10-CM | POA: Diagnosis not present

## 2021-11-11 LAB — RAD ONC ARIA SESSION SUMMARY
Course Elapsed Days: 7
Plan Fractions Treated to Date: 6
Plan Prescribed Dose Per Fraction: 3 Gy
Plan Total Fractions Prescribed: 10
Plan Total Prescribed Dose: 30 Gy
Reference Point Dosage Given to Date: 18 Gy
Reference Point Session Dosage Given: 3 Gy
Session Number: 6

## 2021-11-12 ENCOUNTER — Other Ambulatory Visit: Payer: Self-pay

## 2021-11-12 ENCOUNTER — Ambulatory Visit
Admission: RE | Admit: 2021-11-12 | Discharge: 2021-11-12 | Disposition: A | Discharge status: Home / Self Care | Payer: Federal, State, Local not specified - PPO | Source: Ambulatory Visit | Attending: Radiation Oncology | Admitting: Radiation Oncology

## 2021-11-12 DIAGNOSIS — C7951 Secondary malignant neoplasm of bone: Secondary | ICD-10-CM | POA: Diagnosis not present

## 2021-11-12 LAB — RAD ONC ARIA SESSION SUMMARY
Course Elapsed Days: 8
Plan Fractions Treated to Date: 7
Plan Prescribed Dose Per Fraction: 3 Gy
Plan Total Fractions Prescribed: 10
Plan Total Prescribed Dose: 30 Gy
Reference Point Dosage Given to Date: 21 Gy
Reference Point Session Dosage Given: 3 Gy
Session Number: 7

## 2021-11-13 ENCOUNTER — Ambulatory Visit
Admission: RE | Admit: 2021-11-13 | Discharge: 2021-11-13 | Disposition: A | Discharge status: Home / Self Care | Payer: Federal, State, Local not specified - PPO | Source: Ambulatory Visit | Attending: Radiation Oncology | Admitting: Radiation Oncology

## 2021-11-13 ENCOUNTER — Other Ambulatory Visit: Payer: Self-pay

## 2021-11-13 DIAGNOSIS — C7951 Secondary malignant neoplasm of bone: Secondary | ICD-10-CM | POA: Diagnosis not present

## 2021-11-13 LAB — RAD ONC ARIA SESSION SUMMARY
Course Elapsed Days: 9
Plan Fractions Treated to Date: 8
Plan Prescribed Dose Per Fraction: 3 Gy
Plan Total Fractions Prescribed: 10
Plan Total Prescribed Dose: 30 Gy
Reference Point Dosage Given to Date: 24 Gy
Reference Point Session Dosage Given: 3 Gy
Session Number: 8

## 2021-11-14 ENCOUNTER — Ambulatory Visit
Admission: RE | Admit: 2021-11-14 | Discharge: 2021-11-14 | Disposition: A | Discharge status: Home / Self Care | Payer: Federal, State, Local not specified - PPO | Source: Ambulatory Visit | Attending: Radiation Oncology | Admitting: Radiation Oncology

## 2021-11-14 ENCOUNTER — Other Ambulatory Visit: Payer: Self-pay

## 2021-11-14 DIAGNOSIS — C7951 Secondary malignant neoplasm of bone: Secondary | ICD-10-CM | POA: Diagnosis not present

## 2021-11-14 LAB — RAD ONC ARIA SESSION SUMMARY
Course Elapsed Days: 10
Plan Fractions Treated to Date: 9
Plan Prescribed Dose Per Fraction: 3 Gy
Plan Total Fractions Prescribed: 10
Plan Total Prescribed Dose: 30 Gy
Reference Point Dosage Given to Date: 27 Gy
Reference Point Session Dosage Given: 3 Gy
Session Number: 9

## 2021-11-15 ENCOUNTER — Ambulatory Visit
Admission: RE | Admit: 2021-11-15 | Discharge: 2021-11-15 | Disposition: A | Discharge status: Home / Self Care | Payer: Federal, State, Local not specified - PPO | Source: Ambulatory Visit | Attending: Radiation Oncology | Admitting: Radiation Oncology

## 2021-11-15 ENCOUNTER — Encounter: Payer: Self-pay | Admitting: Urology

## 2021-11-15 ENCOUNTER — Other Ambulatory Visit: Payer: Self-pay

## 2021-11-15 ENCOUNTER — Ambulatory Visit: Payer: Federal, State, Local not specified - PPO

## 2021-11-15 DIAGNOSIS — C7951 Secondary malignant neoplasm of bone: Secondary | ICD-10-CM | POA: Diagnosis not present

## 2021-11-15 LAB — RAD ONC ARIA SESSION SUMMARY
Course Elapsed Days: 11
Plan Fractions Treated to Date: 10
Plan Prescribed Dose Per Fraction: 3 Gy
Plan Total Fractions Prescribed: 10
Plan Total Prescribed Dose: 30 Gy
Reference Point Dosage Given to Date: 30 Gy
Reference Point Session Dosage Given: 3 Gy
Session Number: 10

## 2021-11-18 ENCOUNTER — Ambulatory Visit: Payer: Federal, State, Local not specified - PPO

## 2021-12-15 ENCOUNTER — Other Ambulatory Visit: Payer: Self-pay | Admitting: Oncology

## 2021-12-26 ENCOUNTER — Encounter (HOSPITAL_COMMUNITY): Payer: Self-pay | Admitting: Specialist

## 2021-12-26 ENCOUNTER — Other Ambulatory Visit (HOSPITAL_COMMUNITY): Payer: Self-pay | Admitting: Specialist

## 2021-12-26 DIAGNOSIS — C439 Malignant melanoma of skin, unspecified: Secondary | ICD-10-CM

## 2022-01-02 ENCOUNTER — Ambulatory Visit (HOSPITAL_COMMUNITY)
Admission: RE | Admit: 2022-01-02 | Discharge: 2022-01-02 | Disposition: A | Discharge status: Home / Self Care | Payer: Federal, State, Local not specified - PPO | Source: Ambulatory Visit | Attending: Specialist | Admitting: Specialist

## 2022-01-02 DIAGNOSIS — Z0189 Encounter for other specified special examinations: Secondary | ICD-10-CM | POA: Diagnosis not present

## 2022-01-02 DIAGNOSIS — I517 Cardiomegaly: Secondary | ICD-10-CM | POA: Diagnosis not present

## 2022-01-02 DIAGNOSIS — C439 Malignant melanoma of skin, unspecified: Secondary | ICD-10-CM | POA: Diagnosis not present

## 2022-01-02 LAB — ECHOCARDIOGRAM COMPLETE
AR max vel: 2.68 cm2
AV Area VTI: 2.63 cm2
AV Area mean vel: 2.69 cm2
AV Mean grad: 6 mmHg
AV Peak grad: 12.5 mmHg
Ao pk vel: 1.77 m/s
Area-P 1/2: 3.21 cm2
S' Lateral: 2.9 cm

## 2022-01-02 NOTE — Progress Notes (Signed)
*  PRELIMINARY RESULTS* Echocardiogram 2D Echocardiogram has been performed.  Jared Burton [name] 01/02/2022, 11:35 AM

## 2022-01-13 NOTE — Progress Notes (Signed)
  Radiation Oncology         (336) [phone] ________________________________  Name: Jared Burton MRN: [id number]  Date of Service: 01/15/2022  DOB: 19-Nov-1965  Post Treatment Telephone Note  Diagnosis:  Malignant neoplasm of right femur ([place]) (as documented in provider EOT note)   The patient was not available for call today. A voicemail was left.     The patient is not scheduled for ongoing care in medical oncology. The patient was encouraged to call if he develops concerns or questions regarding radiation.    Leandra Kern, LPN

## 2022-01-14 NOTE — Progress Notes (Addendum)
                                                                                                                                                             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  Plan: The patient will receive a call in about one month from the radiation oncology department. He will continue follow up with Dr. Thyra Breed in medical oncology at [facility] as well.   Nicholos Johns, PA-C    Tyler Pita, MD  [facility] Direct Dial: [phone]  Fax: [phone] [email or web address]  Skype  LinkedIn

## 2022-01-15 ENCOUNTER — Ambulatory Visit
Admission: RE | Admit: 2022-01-15 | Discharge: 2022-01-15 | Disposition: A | Discharge status: Home / Self Care | Payer: Federal, State, Local not specified - PPO | Source: Ambulatory Visit | Attending: Urology | Admitting: Urology

## 2022-01-15 DIAGNOSIS — C7951 Secondary malignant neoplasm of bone: Secondary | ICD-10-CM

## 2022-01-15 NOTE — Progress Notes (Signed)
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  The patient was notified in advance and gave permission to proceed with this visit format.  He tolerated radiation therapy relatively well without any side effects.                              On review of systems, the patient states that he is doing well in general. He has continued with some achy pain in the right thigh, intermittently and worse with activity. The pain does improve with rest and is not severe. For several weeks after completing radiation, he did have more significant pain warranting narcotic pain medication but this has since improved significantly. He continues in routine follow up with Dr. Clance Boll at [facility] and recently changed to targeted therapy due to some disease progression with new lesions in the torso on restaging imaging. He denies any new sites of focal pain and overall, is pleased with his progress regarding the right femur.  ALLERGIES:  has No Known Allergies.  Meds: Current Outpatient Medications  Medication Sig Dispense Refill   acetaminophen (TYLENOL) 500 MG tablet Take by mouth.     fentaNYL (DURAGESIC) 25 MCG/HR Place 1 patch onto the skin every 3 (three) days. (Patient not taking: Reported on  09/02/2021) 10 patch 0   ibuprofen (ADVIL) 200 MG tablet Take by mouth.     IBUPROFEN PO Take by mouth. As needed (Patient not taking: Reported on 09/02/2021)     levocetirizine (XYZAL) 5 MG tablet Take 5 mg by mouth every evening.     levothyroxine (SYNTHROID) 50 MCG tablet TAKE 2 TABLETS(100 MCG) BY MOUTH DAILY BEFORE BREAKFAST 90 tablet 3   oxyCODONE (OXY IR/ROXICODONE) 5 MG immediate release tablet Take 1 tablet (5 mg total) by mouth every 4 (four) hours as needed for severe pain. (Patient not taking: Reported on 09/02/2021) 30 tablet 0   prochlorperazine (COMPAZINE) 10 MG tablet Take 1 tablet (10 mg total) by mouth every 6 (six) hours as needed for nausea or vomiting. (Patient not taking: Reported on 09/02/2021) 30 tablet 3   traMADol (ULTRAM) 50 MG tablet Take 1 tablet (50 mg total) by mouth every 6 (six) hours as needed. (Patient not taking: Reported on 09/02/2021) 30 tablet 1   No current facility-administered medications for this encounter.    Physical Findings:  vitals were not taken for this visit.   /10 Unable to assess due to telephone follow up visit format.  Lab Findings: Lab Results  Component Value Date   WBC 13.4 (H) 07/06/2021   HGB 15.8 07/06/2021   HCT 45.2 07/06/2021   MCV 86.8 07/06/2021   PLT 294 07/06/2021     Radiographic Findings: ECHOCARDIOGRAM COMPLETE  Result Date: 01/02/2022    ECHOCARDIOGRAM REPORT   Patient Name:   Jared Burton Date of Exam: 01/02/2022 Medical Rec #:  [id number]     Height:       74.0 in Accession #:    [id number]    Weight:       206.0 lb Date of Birth:  [birth year]    BSA:          2.201 m Patient Age:    57 years      BP:           166/82 mmHg Patient Gender: M             HR:           68 bpm. Exam Location:  Outpatient Procedure: 2D Echo, Color Doppler, Cardiac Doppler and Strain Analysis Indications:    Metastatic melanoma  History:        Patient has no prior history of Echocardiogram examinations.  Sonographer:    Charmayne Sheer Referring  Phys: [id number] STERGIOS MOSCHOS  Sonographer Comments: Global longitudinal strain was attempted. IMPRESSIONS  1. Left ventricular ejection fraction, by estimation, is 70 to 75%. The left ventricle has hyperdynamic function. The left ventricle has no regional wall motion abnormalities. There is mild concentric left ventricular hypertrophy. Left ventricular diastolic parameters are consistent with Grade I diastolic dysfunction (impaired relaxation). The average left ventricular global longitudinal strain is -18.0 %. The global longitudinal strain is normal.  2. Right ventricular systolic function is normal. The right ventricular size is normal. Tricuspid regurgitation signal is inadequate for assessing PA pressure.  3. The mitral valve is normal in structure. No evidence of mitral valve regurgitation. No evidence of mitral stenosis.  4. The aortic valve is tricuspid. Aortic valve regurgitation is not visualized. No aortic stenosis is present.  5. Aortic dilatation noted. There is mild dilatation of the ascending aorta, measuring 43 mm.  6. The inferior vena cava is normal in size with greater than 50% respiratory variability, suggesting right atrial pressure of 3 mmHg. Comparison(s): No prior Echocardiogram. FINDINGS  Left Ventricle: Left ventricular ejection fraction, by estimation, is 70 to 75%. The left ventricle has hyperdynamic function. The left ventricle has no regional wall motion abnormalities. The average left ventricular global longitudinal strain is -18.0  %. The global longitudinal strain is normal. The left ventricular internal cavity size was normal in size. There is mild concentric left ventricular hypertrophy. Left ventricular diastolic parameters are consistent with Grade I diastolic dysfunction (impaired relaxation). Right Ventricle: The right ventricular size is normal. No increase in right ventricular wall thickness. Right ventricular systolic function is normal. Tricuspid regurgitation signal is  inadequate for assessing PA pressure. Left Atrium: Left atrial size was normal in size. Right Atrium: Right atrial size was normal in size. Pericardium: There is no evidence of pericardial effusion. Mitral Valve: The mitral valve is normal in structure. No evidence of mitral valve regurgitation. No evidence of mitral valve stenosis. Tricuspid Valve: The tricuspid valve is normal in structure. Tricuspid valve regurgitation is not demonstrated. No evidence of tricuspid stenosis. Aortic Valve: The aortic valve is tricuspid. Aortic valve regurgitation is not visualized. No aortic stenosis is present. Aortic valve mean gradient measures 6.0 mmHg. Aortic valve peak gradient measures 12.5 mmHg. Aortic valve area, by VTI measures 2.63  cm. Pulmonic Valve: The pulmonic valve was not well visualized. Pulmonic valve regurgitation is not visualized. No evidence of pulmonic stenosis. Aorta: The aortic root is normal in size and structure  and aortic dilatation noted. There is mild dilatation of the ascending aorta, measuring 43 mm. Venous: The inferior vena cava is normal in size with greater than 50% respiratory variability, suggesting right atrial pressure of 3 mmHg. IAS/Shunts: No atrial level shunt detected by color flow Doppler.  LEFT VENTRICLE PLAX 2D LVIDd:         5.20 cm   Diastology LVIDs:         2.90 cm   LV e' medial:    8.49 cm/s LV PW:         1.10 cm   LV E/e' medial:  9.6 LV IVS:        1.10 cm   LV e' lateral:   7.94 cm/s LVOT diam:     2.30 cm   LV E/e' lateral: 10.3 LV SV:         93 LV SV Index:   42        2D Longitudinal Strain LVOT Area:     4.15 cm  2D Strain GLS Avg:     -18.0 %  RIGHT VENTRICLE RV Basal diam:  3.00 cm LEFT ATRIUM             Index        RIGHT ATRIUM           Index LA diam:        4.20 cm 1.91 cm/m   RA Area:     13.00 cm LA Vol (A2C):   29.8 ml 13.54 ml/m  RA Volume:   31.60 ml  14.36 ml/m LA Vol (A4C):   26.1 ml 11.86 ml/m LA Biplane Vol: 29.0 ml 13.18 ml/m  AORTIC VALVE                      PULMONIC VALVE AV Area (Vmax):    2.68 cm      PV Vmax:       1.17 m/s AV Area (Vmean):   2.69 cm      PV Vmean:      77.800 cm/s AV Area (VTI):     2.63 cm      PV VTI:        0.242 m AV Vmax:           177.00 cm/s   PV Peak grad:  5.5 mmHg AV Vmean:          113.000 cm/s  PV Mean grad:  3.0 mmHg AV VTI:            0.352 m AV Peak Grad:      12.5 mmHg AV Mean Grad:      6.0 mmHg LVOT Vmax:         114.00 cm/s LVOT Vmean:        73.200 cm/s LVOT VTI:          0.223 m LVOT/AV VTI ratio: 0.63  AORTA Ao Root diam: 3.70 cm MITRAL VALVE MV Area (PHT): 3.21 cm    SHUNTS MV Decel Time: 236 msec    Systemic VTI:  0.22 m MV E velocity: 81.90 cm/s  Systemic Diam: 2.30 cm MV A velocity: 65.90 cm/s MV E/A ratio:  1.24 Rudean Haskell MD Electronically signed by Rudean Haskell MD Signature Date/Time: 01/02/2022/2:53:06 PM    Final     Impression/Plan: 1. 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 He appears to have recovered well from the  effects of his recent post-op radiation and is currently without complaints. We discussed that while we are happy to continue to participate in his care if clinically indicated in the future, at this point, we will plan to see him back on an as needed basis. He will continue in routine follow up with Dr. Clance Boll at [facility] for continued management of his systemic disease. We enjoyed taking care of him and look forward to continuing to follow his progress via correspondence.      Nicholos Johns, PA-C

## 2022-03-12 ENCOUNTER — Other Ambulatory Visit: Payer: Self-pay | Admitting: Oncology

## 2022-06-25 ENCOUNTER — Other Ambulatory Visit (HOSPITAL_COMMUNITY): Payer: Self-pay | Admitting: Specialist

## 2022-06-25 DIAGNOSIS — C439 Malignant melanoma of skin, unspecified: Secondary | ICD-10-CM

## 2022-07-22 ENCOUNTER — Ambulatory Visit (HOSPITAL_COMMUNITY)
Admission: RE | Admit: 2022-07-22 | Discharge: 2022-07-22 | Disposition: A | Discharge status: Home / Self Care | Payer: Federal, State, Local not specified - PPO | Source: Ambulatory Visit | Attending: Specialist | Admitting: Specialist

## 2022-07-22 DIAGNOSIS — C439 Malignant melanoma of skin, unspecified: Secondary | ICD-10-CM

## 2022-07-22 DIAGNOSIS — Z0189 Encounter for other specified special examinations: Secondary | ICD-10-CM

## 2022-07-22 DIAGNOSIS — I77819 Aortic ectasia, unspecified site: Secondary | ICD-10-CM | POA: Diagnosis not present

## 2022-07-22 DIAGNOSIS — I517 Cardiomegaly: Secondary | ICD-10-CM | POA: Insufficient documentation

## 2022-07-22 LAB — ECHOCARDIOGRAM COMPLETE
Area-P 1/2: 3.65 cm2
Calc EF: 56.1 %
S' Lateral: 3 cm
Single Plane A2C EF: 57.9 %
Single Plane A4C EF: 53.6 %

## 2022-07-22 NOTE — Progress Notes (Signed)
Echocardiogram 2D Echocardiogram has been performed.  Jared Burton 07/22/2022, 10:48 AM

## 2022-10-07 ENCOUNTER — Other Ambulatory Visit (HOSPITAL_COMMUNITY): Payer: Self-pay | Admitting: Specialist

## 2022-10-07 DIAGNOSIS — Z79899 Other long term (current) drug therapy: Secondary | ICD-10-CM

## 2022-10-07 DIAGNOSIS — C439 Malignant melanoma of skin, unspecified: Secondary | ICD-10-CM

## 2022-11-17 ENCOUNTER — Ambulatory Visit (HOSPITAL_COMMUNITY)
Admission: RE | Admit: 2022-11-17 | Discharge: 2022-11-17 | Disposition: A | Discharge status: Home / Self Care | Payer: Federal, State, Local not specified - PPO | Source: Ambulatory Visit | Attending: Specialist | Admitting: Specialist

## 2022-11-17 DIAGNOSIS — Z79899 Other long term (current) drug therapy: Secondary | ICD-10-CM | POA: Diagnosis not present

## 2022-11-17 DIAGNOSIS — C439 Malignant melanoma of skin, unspecified: Secondary | ICD-10-CM

## 2022-11-17 DIAGNOSIS — I517 Cardiomegaly: Secondary | ICD-10-CM | POA: Diagnosis not present

## 2022-11-17 LAB — ECHOCARDIOGRAM COMPLETE
AR max vel: 1.9 cm2
AV Area VTI: 1.87 cm2
AV Area mean vel: 1.79 cm2
AV Mean grad: 4 mmHg
AV Peak grad: 7 mmHg
Ao pk vel: 1.32 m/s
Area-P 1/2: 2.62 cm2
S' Lateral: 2.9 cm

## 2022-11-17 NOTE — Progress Notes (Signed)
*  PRELIMINARY RESULTS* Echocardiogram 2D Echocardiogram has been performed.  Jared Burton 11/17/2022, 10:45 AM

## 2022-12-25 IMAGING — MR MR HEAD WO/W CM
14 of 16 series · 40 of 48 positions shown · IV contrast (gadavist)
Comparison: None Available.

CLINICAL DATA: Melanoma staging

EXAM:
MRI HEAD WITHOUT AND WITH CONTRAST
TECHNIQUE: Multiplanar, multiecho pulse sequences of the brain and surrounding
structures were obtained without and with intravenous contrast.
CONTRAST:  9.8mL GADAVIST GADOBUTROL 1 MMOL/ML IV SOLN

[Series 5: DWI · axial · 3.0mm · 0.88mm/px · z∈[-25,+126]mm · 6 of 104 slices shown (1 of 4)]
[im 1/104]
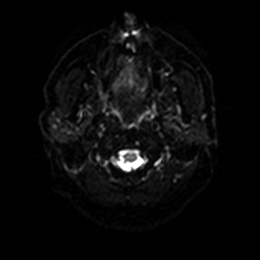
[im 21/104]
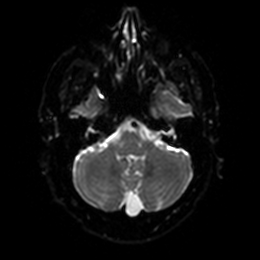
[im 42/104]
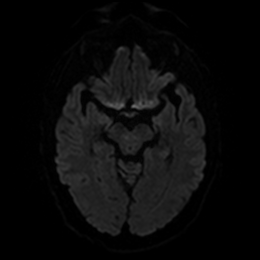
[im 62/104]
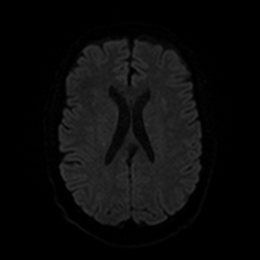
[im 83/104]
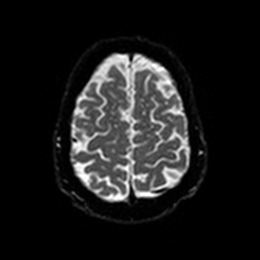
[im 104/104]
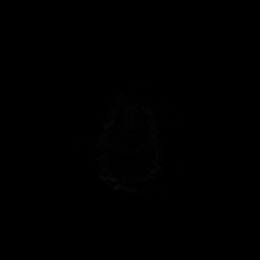

[Series 6: DWI · axial · 3.0mm · 0.88mm/px · z∈[-25,+126]mm · 2 of 51 slices shown (2 of 4)]
[im 1/51]
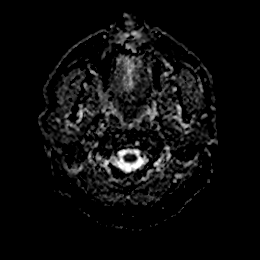
[im 51/51]
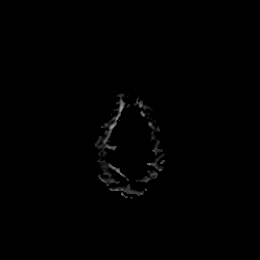

[Series 7: DWI · coronal · 4.0mm · 0.88mm/px · 4 of 74 slices shown (3 of 4)]
[im 1/74]
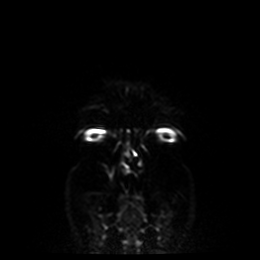
[im 25/74]
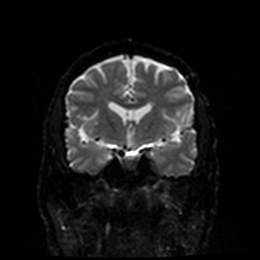
[im 49/74]
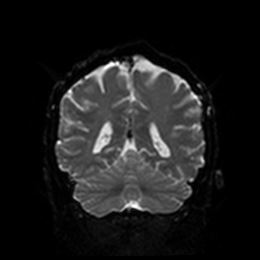
[im 74/74]
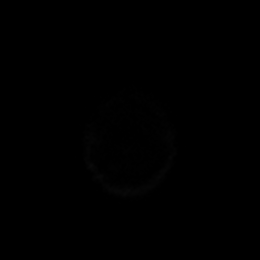

[Series 8: DWI · coronal · 4.0mm · 0.88mm/px · 2 of 37 slices shown (4 of 4)]
[im 1/37]
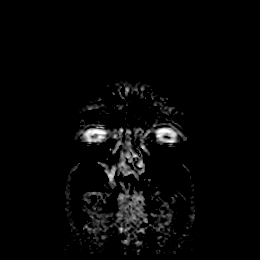
[im 37/37]
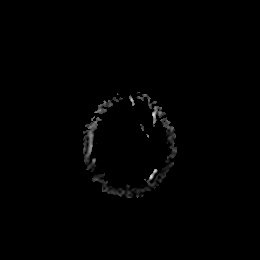

[Series 9: T1 · sagittal · 5.0mm · 0.75mm/px · 2 of 25 slices shown]
[im 1/25]
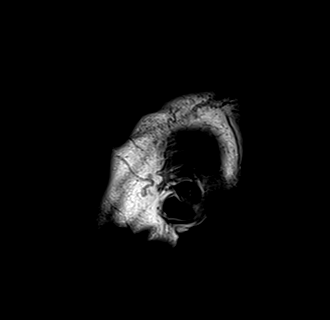
[im 25/25]
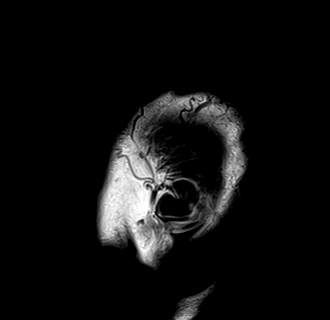

[Series 10: T2 · axial · 5.0mm · 0.72mm/px · z∈[-24,+125]mm · 2 of 26 slices shown]
[im 1/26]
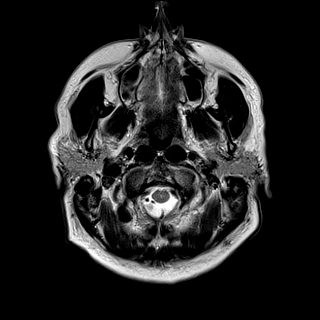
[im 26/26]
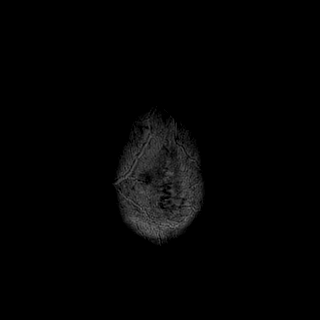

[Series 11: FLAIR · axial · 5.0mm · 0.45mm/px · z∈[-25,+124]mm · 2 of 26 slices shown]
[im 1/26]
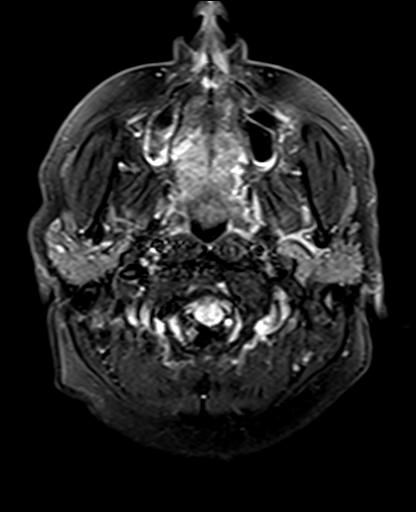
[im 26/26]
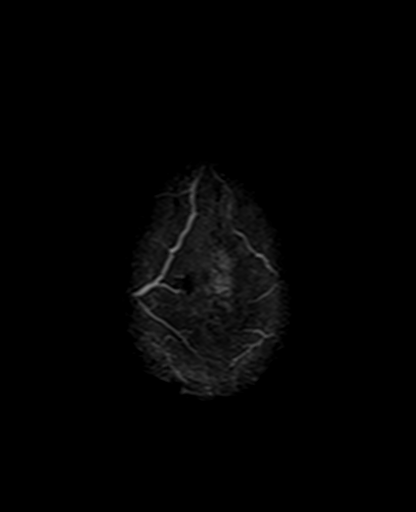

[Series 12: mag_images · axial · 3.0mm · 0.90mm/px · z∈[-32,+132]mm · 4 of 56 slices shown]
[im 1/56]
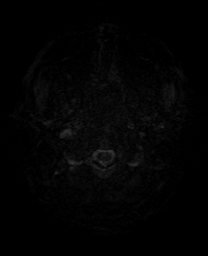
[im 19/56]
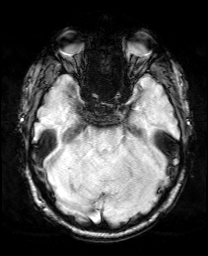
[im 37/56]
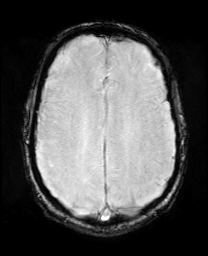
[im 56/56]
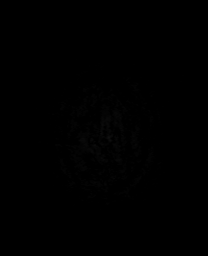

[Series 13: pha_images · axial · 3.0mm · 0.90mm/px · z∈[-29,+129]mm · 3 of 54 slices shown]
[im 1/54]
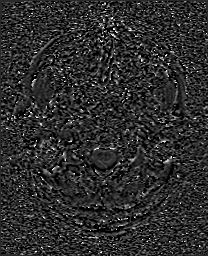
[im 27/54]
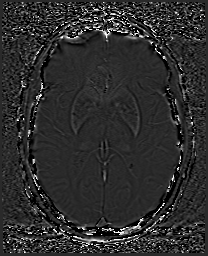
[im 54/54]
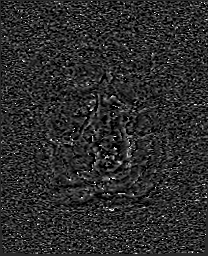

[Series 14: swi_images · axial · 3.0mm · 0.90mm/px · z∈[-32,+132]mm · 4 of 56 slices shown]
[im 1/56]
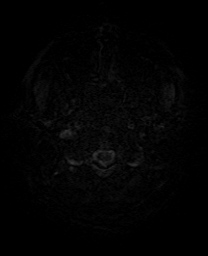
[im 19/56]
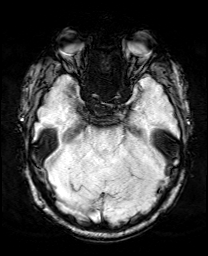
[im 37/56]
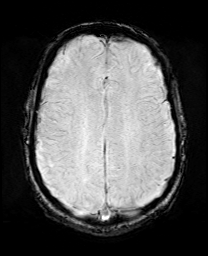
[im 56/56]
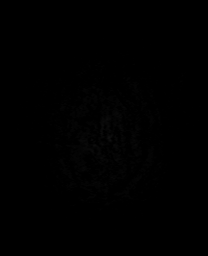

[Series 15: mip_images(sw) · axial · 24.0mm · 0.90mm/px · z∈[-22,+121]mm · 3 of 49 slices shown]
[im 1/49]
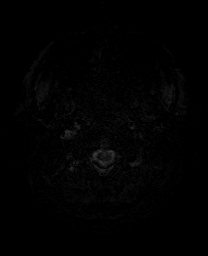
[im 25/49]
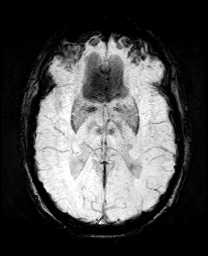
[im 49/49]
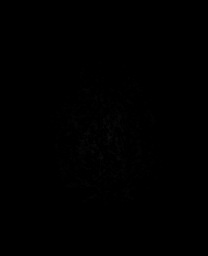

[Series 17: T2 post-contrast · coronal · 5.0mm · 0.72mm/px · 2 of 30 slices shown]
[im 1/30]
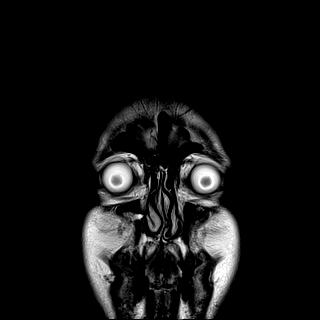
[im 30/30]
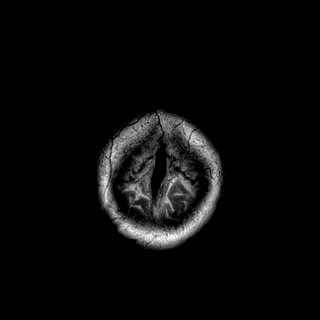

[Series 19: T1 post-contrast · coronal · 5.0mm · 0.34mm/px · 2 of 30 slices shown (1 of 2)]
[im 1/30]
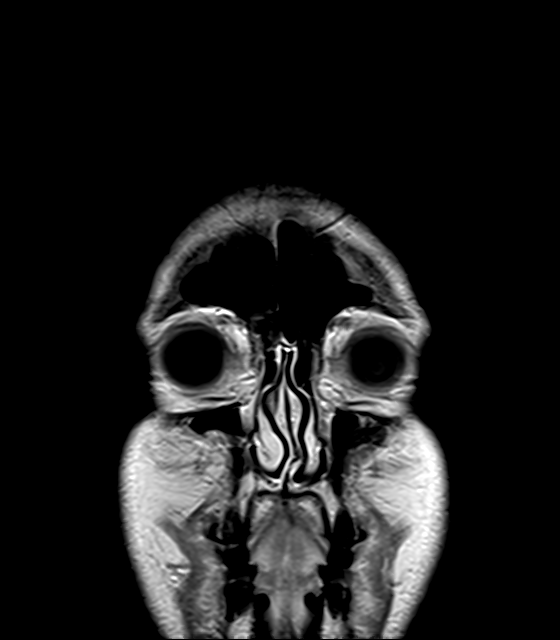
[im 30/30]
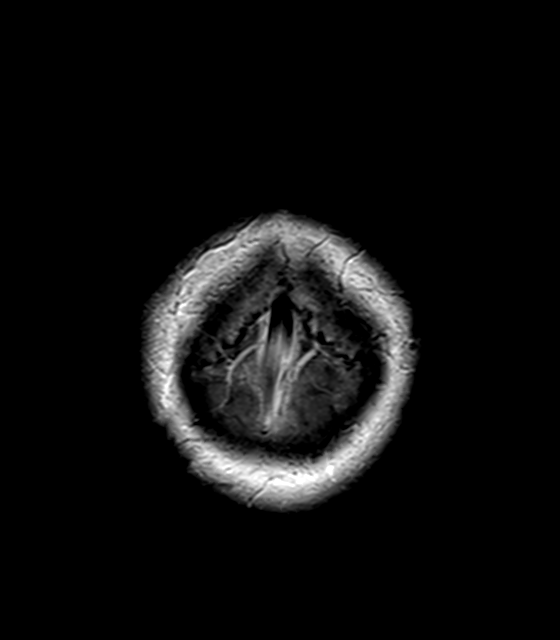

[Series 20: T1 post-contrast · sagittal · 5.0mm · 0.75mm/px · 2 of 25 slices shown (2 of 2)]
[im 1/25]
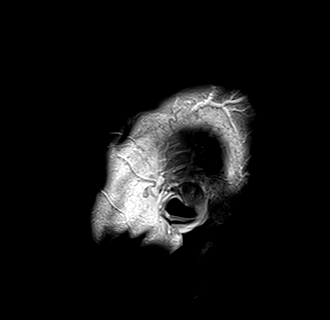
[im 25/25]
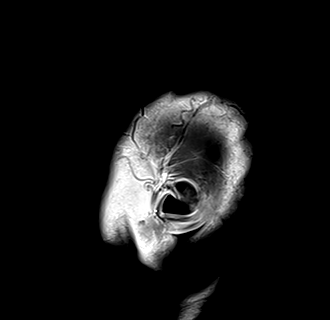

[40 of 48 positions shown; findings below may reference images not displayed]

FINDINGS: Brain: No enhancement or swelling to suggest metastatic disease. No
infarction, hemorrhage, hydrocephalus, extra-axial collection or
mass lesion.

Vascular: Normal flow voids and vascular enhancements

Skull and upper cervical spine: Normal marrow signal.

Sinuses/Orbits: Negative

Other: Occipital scalp scarring.
IMPRESSION: Negative for metastatic disease to the brain.

## 2023-01-14 NOTE — Telephone Encounter (Signed)
Telephone call  

## 2023-05-27 ENCOUNTER — Other Ambulatory Visit (HOSPITAL_COMMUNITY): Payer: Self-pay | Admitting: Specialist

## 2023-05-27 DIAGNOSIS — C4371 Malignant melanoma of right lower limb, including hip: Secondary | ICD-10-CM

## 2023-06-04 ENCOUNTER — Ambulatory Visit (HOSPITAL_COMMUNITY)
Admission: RE | Admit: 2023-06-04 | Discharge: 2023-06-04 | Disposition: A | Discharge status: Home / Self Care | Source: Ambulatory Visit | Attending: Specialist | Admitting: Specialist

## 2023-06-04 DIAGNOSIS — C4371 Malignant melanoma of right lower limb, including hip: Secondary | ICD-10-CM | POA: Insufficient documentation

## 2023-06-04 MED ORDER — GADOBUTROL 1 MMOL/ML IV SOLN
9.0000 mL | Freq: Once | INTRAVENOUS | Status: AC | PRN
  Administered 2023-06-04: 9 mL via INTRAVENOUS
